# Patient Record
Sex: Female | Born: 1960 | Race: White | Hispanic: No | Marital: Married | Smoking: Never smoker
Health system: Southern US, Community
[De-identification: ages and names within clinical notes are randomized; demographics above are authoritative.]

## PROBLEM LIST (undated history)

## (undated) DIAGNOSIS — T7840XA Allergy, unspecified, initial encounter: Secondary | ICD-10-CM

## (undated) DIAGNOSIS — Z9109 Other allergy status, other than to drugs and biological substances: Secondary | ICD-10-CM

## (undated) DIAGNOSIS — E079 Disorder of thyroid, unspecified: Secondary | ICD-10-CM

## (undated) DIAGNOSIS — E785 Hyperlipidemia, unspecified: Secondary | ICD-10-CM

## (undated) DIAGNOSIS — N200 Calculus of kidney: Secondary | ICD-10-CM

## (undated) HISTORY — DX: Disorder of thyroid, unspecified: E07.9

## (undated) HISTORY — DX: Calculus of kidney: N20.0

## (undated) HISTORY — DX: Hyperlipidemia, unspecified: E78.5

## (undated) HISTORY — DX: Allergy, unspecified, initial encounter: T78.40XA

## (undated) HISTORY — PX: COLONOSCOPY: SHX174

## (undated) HISTORY — DX: Other allergy status, other than to drugs and biological substances: Z91.09

---
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
## 1997-09-05 ENCOUNTER — Other Ambulatory Visit: Admission: RE | Admit: 1997-09-05 | Discharge: 1997-09-05 | Payer: Self-pay | Admitting: Obstetrics and Gynecology

## 1998-09-16 ENCOUNTER — Other Ambulatory Visit: Admission: RE | Admit: 1998-09-16 | Discharge: 1998-09-16 | Payer: Self-pay | Admitting: Obstetrics and Gynecology

## 1999-09-25 ENCOUNTER — Other Ambulatory Visit: Admission: RE | Admit: 1999-09-25 | Discharge: 1999-09-25 | Payer: Self-pay | Admitting: Gynecology

## 2001-08-28 ENCOUNTER — Other Ambulatory Visit: Admission: RE | Admit: 2001-08-28 | Discharge: 2001-08-28 | Payer: Self-pay | Admitting: Gynecology

## 2002-08-08 ENCOUNTER — Other Ambulatory Visit: Admission: RE | Admit: 2002-08-08 | Discharge: 2002-08-08 | Payer: Self-pay | Admitting: Gynecology

## 2003-09-04 ENCOUNTER — Other Ambulatory Visit: Admission: RE | Admit: 2003-09-04 | Discharge: 2003-09-04 | Payer: Self-pay | Admitting: Gynecology

## 2004-09-14 ENCOUNTER — Other Ambulatory Visit: Admission: RE | Admit: 2004-09-14 | Discharge: 2004-09-14 | Payer: Self-pay | Admitting: Gynecology

## 2005-02-15 ENCOUNTER — Other Ambulatory Visit: Admission: RE | Admit: 2005-02-15 | Discharge: 2005-02-15 | Payer: Self-pay | Admitting: Gynecology

## 2005-11-05 DIAGNOSIS — N200 Calculus of kidney: Secondary | ICD-10-CM

## 2005-11-05 HISTORY — DX: Calculus of kidney: N20.0

## 2005-11-08 ENCOUNTER — Other Ambulatory Visit: Admission: RE | Admit: 2005-11-08 | Discharge: 2005-11-08 | Payer: Self-pay | Admitting: Gynecology

## 2005-11-09 ENCOUNTER — Ambulatory Visit: Payer: Self-pay | Admitting: Family Medicine

## 2006-06-07 DIAGNOSIS — E079 Disorder of thyroid, unspecified: Secondary | ICD-10-CM

## 2006-06-07 HISTORY — DX: Disorder of thyroid, unspecified: E07.9

## 2006-06-20 ENCOUNTER — Ambulatory Visit: Payer: Self-pay | Admitting: Family Medicine

## 2006-08-29 ENCOUNTER — Ambulatory Visit: Payer: Self-pay | Admitting: Family Medicine

## 2006-08-29 LAB — CONVERTED CEMR LAB
ALT: 23 units/L (ref 0–40)
AST: 26 units/L (ref 0–37)
Albumin: 3.7 g/dL (ref 3.5–5.2)
Alkaline Phosphatase: 35 units/L — ABNORMAL LOW (ref 39–117)
BUN: 10 mg/dL (ref 6–23)
Basophils Absolute: 0.1 10*3/uL (ref 0.0–0.1)
Basophils Relative: 1 % (ref 0.0–1.0)
Bilirubin, Direct: 0.1 mg/dL (ref 0.0–0.3)
CO2: 30 meq/L (ref 19–32)
Calcium: 9.1 mg/dL (ref 8.4–10.5)
Chloride: 105 meq/L (ref 96–112)
Creatinine, Ser: 0.8 mg/dL (ref 0.4–1.2)
Eosinophils Absolute: 0.1 10*3/uL (ref 0.0–0.6)
Eosinophils Relative: 1.4 % (ref 0.0–5.0)
GFR calc Af Amer: 101 mL/min
GFR calc non Af Amer: 83 mL/min
Glucose, Bld: 91 mg/dL (ref 70–99)
HCT: 43.5 % (ref 36.0–46.0)
Hemoglobin: 14.8 g/dL (ref 12.0–15.0)
Lymphocytes Relative: 27.3 % (ref 12.0–46.0)
MCHC: 34 g/dL (ref 30.0–36.0)
MCV: 92.9 fL (ref 78.0–100.0)
Monocytes Absolute: 0.3 10*3/uL (ref 0.2–0.7)
Monocytes Relative: 6 % (ref 3.0–11.0)
Neutro Abs: 3.3 10*3/uL (ref 1.4–7.7)
Neutrophils Relative %: 64.3 % (ref 43.0–77.0)
Platelets: 294 10*3/uL (ref 150–400)
Potassium: 4.1 meq/L (ref 3.5–5.1)
RBC: 4.68 M/uL (ref 3.87–5.11)
RDW: 11.8 % (ref 11.5–14.6)
Sodium: 140 meq/L (ref 135–145)
TSH: 9.01 microintl units/mL — ABNORMAL HIGH (ref 0.35–5.50)
Total Bilirubin: 0.8 mg/dL (ref 0.3–1.2)
Total Protein: 6.9 g/dL (ref 6.0–8.3)
WBC: 5.2 10*3/uL (ref 4.5–10.5)

## 2006-09-06 ENCOUNTER — Ambulatory Visit: Payer: Self-pay | Admitting: Family Medicine

## 2006-09-06 LAB — CONVERTED CEMR LAB
Free T4: 0.5 ng/dL — ABNORMAL LOW (ref 0.6–1.6)
T3 Uptake Ratio: 34.5 % (ref 22.5–37.0)
T3, Free: 3.5 pg/mL (ref 2.3–4.2)
TSH: 8.76 microintl units/mL — ABNORMAL HIGH (ref 0.35–5.50)

## 2006-11-10 ENCOUNTER — Other Ambulatory Visit: Admission: RE | Admit: 2006-11-10 | Discharge: 2006-11-10 | Payer: Self-pay | Admitting: Gynecology

## 2006-11-11 ENCOUNTER — Ambulatory Visit: Payer: Self-pay | Admitting: Family Medicine

## 2006-11-11 DIAGNOSIS — E538 Deficiency of other specified B group vitamins: Secondary | ICD-10-CM | POA: Insufficient documentation

## 2006-11-11 DIAGNOSIS — M7120 Synovial cyst of popliteal space [Baker], unspecified knee: Secondary | ICD-10-CM | POA: Insufficient documentation

## 2006-11-11 DIAGNOSIS — J301 Allergic rhinitis due to pollen: Secondary | ICD-10-CM | POA: Insufficient documentation

## 2006-12-29 ENCOUNTER — Telehealth: Payer: Self-pay | Admitting: Family Medicine

## 2007-01-17 ENCOUNTER — Ambulatory Visit: Payer: Self-pay | Admitting: Family Medicine

## 2007-01-17 ENCOUNTER — Telehealth (INDEPENDENT_AMBULATORY_CARE_PROVIDER_SITE_OTHER): Payer: Self-pay | Admitting: *Deleted

## 2007-01-17 DIAGNOSIS — L255 Unspecified contact dermatitis due to plants, except food: Secondary | ICD-10-CM | POA: Insufficient documentation

## 2007-07-03 ENCOUNTER — Ambulatory Visit: Payer: Self-pay | Admitting: Internal Medicine

## 2007-07-03 DIAGNOSIS — R599 Enlarged lymph nodes, unspecified: Secondary | ICD-10-CM | POA: Insufficient documentation

## 2007-07-03 DIAGNOSIS — M26629 Arthralgia of temporomandibular joint, unspecified side: Secondary | ICD-10-CM | POA: Insufficient documentation

## 2007-07-03 DIAGNOSIS — E039 Hypothyroidism, unspecified: Secondary | ICD-10-CM | POA: Insufficient documentation

## 2007-07-14 ENCOUNTER — Telehealth (INDEPENDENT_AMBULATORY_CARE_PROVIDER_SITE_OTHER): Payer: Self-pay | Admitting: *Deleted

## 2007-08-22 ENCOUNTER — Telehealth (INDEPENDENT_AMBULATORY_CARE_PROVIDER_SITE_OTHER): Payer: Self-pay | Admitting: *Deleted

## 2007-11-30 ENCOUNTER — Other Ambulatory Visit: Admission: RE | Admit: 2007-11-30 | Discharge: 2007-11-30 | Payer: Self-pay | Admitting: Gynecology

## 2007-12-06 ENCOUNTER — Encounter: Payer: Self-pay | Admitting: Family Medicine

## 2007-12-06 LAB — CONVERTED CEMR LAB: Pap Smear: NORMAL

## 2007-12-18 ENCOUNTER — Ambulatory Visit: Payer: Self-pay | Admitting: Family Medicine

## 2007-12-22 ENCOUNTER — Telehealth (INDEPENDENT_AMBULATORY_CARE_PROVIDER_SITE_OTHER): Payer: Self-pay | Admitting: *Deleted

## 2008-02-27 ENCOUNTER — Telehealth (INDEPENDENT_AMBULATORY_CARE_PROVIDER_SITE_OTHER): Payer: Self-pay | Admitting: *Deleted

## 2008-07-02 ENCOUNTER — Ambulatory Visit: Payer: Self-pay | Admitting: Family Medicine

## 2008-07-02 DIAGNOSIS — J069 Acute upper respiratory infection, unspecified: Secondary | ICD-10-CM | POA: Insufficient documentation

## 2008-07-02 LAB — CONVERTED CEMR LAB
Inflenza A Ag: NEGATIVE
Influenza B Ag: NEGATIVE

## 2008-09-05 ENCOUNTER — Telehealth (INDEPENDENT_AMBULATORY_CARE_PROVIDER_SITE_OTHER): Payer: Self-pay | Admitting: *Deleted

## 2008-12-10 ENCOUNTER — Telehealth (INDEPENDENT_AMBULATORY_CARE_PROVIDER_SITE_OTHER): Payer: Self-pay | Admitting: *Deleted

## 2008-12-26 ENCOUNTER — Ambulatory Visit: Payer: Self-pay | Admitting: Family Medicine

## 2008-12-26 DIAGNOSIS — R5381 Other malaise: Secondary | ICD-10-CM | POA: Insufficient documentation

## 2008-12-26 DIAGNOSIS — R5383 Other fatigue: Secondary | ICD-10-CM | POA: Insufficient documentation

## 2008-12-31 ENCOUNTER — Telehealth (INDEPENDENT_AMBULATORY_CARE_PROVIDER_SITE_OTHER): Payer: Self-pay | Admitting: *Deleted

## 2008-12-31 LAB — CONVERTED CEMR LAB
ALT: 14 units/L (ref 0–35)
AST: 21 units/L (ref 0–37)
Albumin: 3.7 g/dL (ref 3.5–5.2)
Alkaline Phosphatase: 30 units/L — ABNORMAL LOW (ref 39–117)
BUN: 13 mg/dL (ref 6–23)
Basophils Absolute: 0 10*3/uL (ref 0.0–0.1)
Basophils Relative: 0.2 % (ref 0.0–3.0)
Bilirubin, Direct: 0.1 mg/dL (ref 0.0–0.3)
CO2: 29 meq/L (ref 19–32)
Calcium: 8.9 mg/dL (ref 8.4–10.5)
Chloride: 107 meq/L (ref 96–112)
Cholesterol: 172 mg/dL (ref 0–200)
Creatinine, Ser: 0.8 mg/dL (ref 0.4–1.2)
Eosinophils Absolute: 0.1 10*3/uL (ref 0.0–0.7)
Eosinophils Relative: 2.4 % (ref 0.0–5.0)
Folate: 13.5 ng/mL
Free T4: 0.8 ng/dL (ref 0.6–1.6)
GFR calc non Af Amer: 81.96 mL/min (ref >60)
Glucose, Bld: 82 mg/dL (ref 70–99)
HCT: 42.1 % (ref 36.0–46.0)
HDL: 51 mg/dL (ref >39.00)
Hemoglobin: 14.5 g/dL (ref 12.0–15.0)
LDL Cholesterol: 101 mg/dL — ABNORMAL HIGH (ref 0–99)
Lymphocytes Relative: 26.3 % (ref 12.0–46.0)
Lymphs Abs: 1.6 10*3/uL (ref 0.7–4.0)
MCHC: 34.3 g/dL (ref 30.0–36.0)
MCV: 95.7 fL (ref 78.0–100.0)
Monocytes Absolute: 0.3 10*3/uL (ref 0.1–1.0)
Monocytes Relative: 5.9 % (ref 3.0–12.0)
Neutro Abs: 3.9 10*3/uL (ref 1.4–7.7)
Neutrophils Relative %: 65.2 % (ref 43.0–77.0)
Platelets: 245 10*3/uL (ref 150.0–400.0)
Potassium: 4.4 meq/L (ref 3.5–5.1)
RBC: 4.4 M/uL (ref 3.87–5.11)
RDW: 11.3 % — ABNORMAL LOW (ref 11.5–14.6)
Sodium: 140 meq/L (ref 135–145)
T3, Free: 3.1 pg/mL (ref 2.3–4.2)
TSH: 5.4 microintl units/mL (ref 0.35–5.50)
Total Bilirubin: 1 mg/dL (ref 0.3–1.2)
Total CHOL/HDL Ratio: 3
Total Protein: 6.9 g/dL (ref 6.0–8.3)
Triglycerides: 102 mg/dL (ref 0.0–149.0)
VLDL: 20.4 mg/dL (ref 0.0–40.0)
Vitamin B-12: 183 pg/mL — ABNORMAL LOW (ref 211–911)
WBC: 5.9 10*3/uL (ref 4.5–10.5)

## 2009-01-01 ENCOUNTER — Ambulatory Visit: Payer: Self-pay | Admitting: Family Medicine

## 2009-01-01 DIAGNOSIS — D518 Other vitamin B12 deficiency anemias: Secondary | ICD-10-CM | POA: Insufficient documentation

## 2009-01-08 ENCOUNTER — Ambulatory Visit: Payer: Self-pay | Admitting: Family Medicine

## 2009-01-15 ENCOUNTER — Telehealth (INDEPENDENT_AMBULATORY_CARE_PROVIDER_SITE_OTHER): Payer: Self-pay | Admitting: *Deleted

## 2009-01-15 ENCOUNTER — Ambulatory Visit: Payer: Self-pay | Admitting: Family Medicine

## 2009-01-22 ENCOUNTER — Ambulatory Visit: Payer: Self-pay | Admitting: Family Medicine

## 2009-04-30 ENCOUNTER — Ambulatory Visit: Payer: Self-pay | Admitting: Women's Health

## 2009-04-30 ENCOUNTER — Other Ambulatory Visit: Admission: RE | Admit: 2009-04-30 | Discharge: 2009-04-30 | Payer: Self-pay | Admitting: Gynecology

## 2009-07-08 ENCOUNTER — Telehealth (INDEPENDENT_AMBULATORY_CARE_PROVIDER_SITE_OTHER): Payer: Self-pay | Admitting: *Deleted

## 2009-07-09 ENCOUNTER — Ambulatory Visit: Payer: Self-pay | Admitting: Family Medicine

## 2009-07-11 ENCOUNTER — Encounter: Payer: Self-pay | Admitting: Family Medicine

## 2009-11-05 ENCOUNTER — Telehealth (INDEPENDENT_AMBULATORY_CARE_PROVIDER_SITE_OTHER): Payer: Self-pay | Admitting: *Deleted

## 2009-11-19 ENCOUNTER — Ambulatory Visit: Payer: Self-pay | Admitting: Family Medicine

## 2009-11-21 LAB — CONVERTED CEMR LAB
ALT: 19 units/L (ref 0–35)
AST: 24 units/L (ref 0–37)
Albumin: 4 g/dL (ref 3.5–5.2)
Alkaline Phosphatase: 37 units/L — ABNORMAL LOW (ref 39–117)
Bilirubin, Direct: 0.1 mg/dL (ref 0.0–0.3)
Cholesterol: 190 mg/dL (ref 0–200)
Folate: 14.3 ng/mL
HDL: 61.6 mg/dL (ref >39.00)
LDL Cholesterol: 119 mg/dL — ABNORMAL HIGH (ref 0–99)
Total Bilirubin: 0.6 mg/dL (ref 0.3–1.2)
Total CHOL/HDL Ratio: 3
Total Protein: 6.7 g/dL (ref 6.0–8.3)
Triglycerides: 47 mg/dL (ref 0.0–149.0)
VLDL: 9.4 mg/dL (ref 0.0–40.0)
Vitamin B-12: 402 pg/mL (ref 211–911)

## 2009-11-27 ENCOUNTER — Encounter: Payer: Self-pay | Admitting: Family Medicine

## 2010-05-08 ENCOUNTER — Other Ambulatory Visit
Admission: RE | Admit: 2010-05-08 | Discharge: 2010-05-08 | Payer: Self-pay | Source: Home / Self Care | Admitting: Gynecology

## 2010-05-08 ENCOUNTER — Ambulatory Visit: Payer: Self-pay | Admitting: Women's Health

## 2010-05-29 ENCOUNTER — Encounter: Payer: Self-pay | Admitting: Family Medicine

## 2010-07-07 NOTE — Progress Notes (Signed)
  Phone Note Call from Patient   Caller: Patient Summary of Call: pt called in re to her rx Levothyroxine, has been out for  5 days now, (rx filled 12/04/08 on our end) called and spoke with [facility] pharmacy who did not receive electronic rx, gave verbal refill which was don 12/04/08. Called pt and informed pharmacist  getting rx ready apoligized for the inconvenience Initial call taken by: Kandice Hams,  December 10, 2008 9:40 AM

## 2010-07-07 NOTE — Progress Notes (Signed)
Summary: TB skin test  Phone Note Outgoing Call   Summary of Call: Called and left message for pt- we got her forms but we do not have a recent TB skin test on file for her, she will need to come in and have one done before the form can be completed. Army Fossa CMA  July 08, 2009 12:07 PM

## 2010-07-07 NOTE — Progress Notes (Signed)
Summary: [facility] 09/05/08  Phone Note Call from Patient Call back at Home Phone [phone]   Caller: Patient Call For: Loreen Freud DO Summary of Call: Patient called office not feeling well would like call back.  Left message for patient to call the office. Ardyth Man  September 05, 2008 10:29 AM   Follow-up for Phone Call        Patient coming in on Monday at 1:30 09/09/08. Ardyth Man  September 05, 2008 11:37 AM  Follow-up by: Ardyth Man,  September 05, 2008 11:37 AM

## 2010-07-07 NOTE — Progress Notes (Signed)
Summary: RX B12//[facility]  Phone Note Call from Patient   Caller: Patient Summary of Call: Patient is requesting a prescription for B12 so a nurse can give her one at home. contact#[phone] pharmacy [facility] [address]. Initial call taken by: Barb Merino,  January 15, 2009 8:54 AM  Follow-up for Phone Call        B12  1ml subcutaneously weekly for 1 months then monthly--  she will need syringes as well 1 vial b12 Follow-up by: Loreen Freud DO,  January 15, 2009 12:27 PM  Additional Follow-up for Phone Call Additional follow up Details #1::        pt aware rx sent to pharmacy.Marland KitchenMarland KitchenFelecia Deloach CMA  January 16, 2009 9:40 AM    New/Updated Medications: CYANOCOBALAMIN 1000 MCG/ML SOLN (CYANOCOBALAMIN) 1ml subcutaneously weekly for 1 months then monthly SYRINGE 2-3 ML 3 ML MISC (SYRINGE (DISPOSABLE)) 1ml subcutaneously weekly for 1 months then monthly Prescriptions: SYRINGE 2-3 ML 3 ML MISC (SYRINGE (DISPOSABLE)) 1ml subcutaneously weekly for 1 months then monthly  #30 days x 2   Entered by:   Jeremy Johann CMA   Authorized by:   Loreen Freud DO   Signed by:   Jeremy Johann CMA on 01/16/2009   Method used:   Faxed to ...       [facility]       [address]       [place], [place]  [address]       Ph: [phone]       Fax: [phone]   RxID:   [id number] CYANOCOBALAMIN 1000 MCG/ML SOLN (CYANOCOBALAMIN) 1ml subcutaneously weekly for 1 months then monthly  #30 days x 2   Entered by:   Jeremy Johann CMA   Authorized by:   Loreen Freud DO   Signed by:   Jeremy Johann CMA on 01/16/2009   Method used:   Faxed to ...       [facility]       [address]       [place], [place]  [address]       Ph: [phone]       Fax: [phone]   RxID:   [id number]

## 2010-07-07 NOTE — Progress Notes (Signed)
Summary: refill - dr Laury Axon  Phone Note Refill Request   Refills Requested: Medication #1:  ZOCOR 40 MG TABS 1 at bedtime   Last Refilled: 440347 patient said pharm faxed request several times we never received she has been out of med a week [facility] [address] ---- [facility] am i spoke with the pharm he faxed request which took 20 mins to get here - l  Initial call taken by: Okey Regal Spring,  July 14, 2007 9:27 AM    New/Updated Medications: ZOCOR 40 MG TABS (SIMVASTATIN) 1 at bedtime   Prescriptions: ZOCOR 40 MG TABS (SIMVASTATIN) 1 at bedtime  #30 x 0   Entered by:   Doristine Devoid   Authorized by:   Loreen Freud DO   Signed by:   Doristine Devoid on 07/14/2007   Method used:   Electronically sent to ...       [facility]       [address]       [place], [place]  [address]       Ph: [phone]       Fax: [phone]   RxID:   [id number]

## 2010-07-07 NOTE — Assessment & Plan Note (Signed)
Summary: acute only for vomiting/Ph   Vital Signs:  Patient Profile:   50 Years Old Female Height:     62 inches Weight:      127.4 pounds Temp:     98.3 degrees F oral Pulse rate:   72 / minute BP sitting:   100 / 68  (left arm)  Pt. in pain?   no  Vitals Entered By: Jeremy Johann CMA (July 02, 2008 3:32 PM)                  PCP:  Laury Axon  Chief Complaint:  onset sunday vomiting, diarrhea, fever 100 to 101, and pain in rt side of face.  History of Present Illness: Pt here c/o nvd on Sunday that stopped next day and now left side of face hurts and teeth.  + fever as high as 101.  Pt was taking allergy sinus med and tylenol with some relief but she still feels pain in jaw and forhead.      Current Allergies (reviewed today): No known allergies   Past Medical History:    Reviewed history from 07/03/2007 and no changes required:       Hyperlipidemia       Hypothyroidism   Family History:    Reviewed history from 11/11/2006 and no changes required:       Family History of Prostate CA 1st degree relative <50  Social History:    Reviewed history from 12/18/2007 and no changes required:       Never Smoked       Regular exercise-yes       Married       Alcohol use-no       Drug use-no       Occupation: housewife   Risk Factors: Tobacco use:  never Passive smoke exposure:  no Drug use:  no HIV high-risk behavior:  no Caffeine use:  0 drinks per day Alcohol use:  no Exercise:  yes    Times per week:  5    Type:  running Seatbelt use:  100 %  Family History Risk Factors:    Family History of MI in females < 22 years old:  no    Family History of MI in males < 69 years old:  no  Mammogram History:    Date of Last Mammogram:  12/06/2007  PAP Smear History:    Date of Last PAP Smear:  12/06/2007   Review of Systems      See HPI   Physical Exam  General:     Well-developed,well-nourished,in no acute distress; alert,appropriate and cooperative  throughout examination Ears:     External ear exam shows no significant lesions or deformities.  Otoscopic examination reveals clear canals, tympanic membranes are intact bilaterally without bulging, retraction, inflammation or discharge. Hearing is grossly normal bilaterally. Nose:     External nasal examination shows no deformity or inflammation. Nasal mucosa are pink and moist without lesions or exudates. Mouth:     Oral mucosa and oropharynx without lesions or exudates.  Teeth in good repair. Neck:     No deformities, masses, or tenderness noted. Lungs:     Normal respiratory effort, chest expands symmetrically. Lungs are clear to auscultation, no crackles or wheezes. Heart:     normal rate, regular rhythm, and no murmur.   Skin:     Intact without suspicious lesions or rashes Cervical Nodes:     No lymphadenopathy noted Psych:     Cognition and judgment  appear intact. Alert and cooperative with normal attention span and concentration. No apparent delusions, illusions, hallucinations    Impression & Recommendations:  Problem # 1:  VIRAL URI (ICD-465.9) Instructed on symptomatic treatment. Call if symptoms persist or worsen.  Orders: Flu A+B (13244)   Complete Medication List: 1)  Zocor 40 Mg Tabs (Simvastatin) .Marland Kitchen.. 1 at bedtime 2)  Seasonique Tabs (Levonorgest-eth estrad 91-day tabs) .Marland Kitchen.. 1 once daily 3)  Levothyroxine Sodium 50 Mcg Tabs (Levothyroxine sodium) .... Take one tablet daily.     Laboratory Results   Date/Time Reported: July 02, 2008 4:25 PM  Other Tests  Influenza A: negative Influenza B: negative

## 2010-07-07 NOTE — Progress Notes (Signed)
  Phone Note Call from Patient Call back at Home Phone [phone]   Caller: Patient Call For: Loreen Freud DO Summary of Call: Patient would like lab results. Ardyth Man  December 31, 2008 4:20 PM   Follow-up for Phone Call        see labs Follow-up by: Loreen Freud DO,  December 31, 2008 4:58 PM  Additional Follow-up for Phone Call Additional follow up Details #1::        cholesterol--- good---recheck 6 months-----272.4  lipid, hep b12 low---- injections weekly for 1 month then 1 x a month---recheck 1 month Patient aware and is coming in today for her 1st b12 injection. Ardyth Man  January 01, 2009 10:20 AM  Additional Follow-up by: Ardyth Man,  January 01, 2009 10:20 AM

## 2010-07-07 NOTE — Progress Notes (Signed)
Summary: refill   Phone Note Refill Request   Refills Requested: Medication #1:  ZOCOR 40 MG TABS 1 at bedtime [facility] [address] patient has office on 4.29.09 @ 10:00  Initial call taken by: Charolette Child,  August 22, 2007 2:34 PM      Prescriptions: ZOCOR 40 MG TABS (SIMVASTATIN) 1 at bedtime  #30 x 1   Entered by:   Shonna Chock   Authorized by:   Loreen Freud DO   Signed by:   Shonna Chock on 08/22/2007   Method used:   Electronically sent to ...       [facility]       [address]       [place], [place]  [address]       Ph: [phone]       Fax: [phone]   RxID:   [id number]

## 2010-07-07 NOTE — Assessment & Plan Note (Signed)
Summary: cpx--tl   Vital Signs:  Patient Profile:   50 Years Old Female Height:     62 inches Weight:      129.4 pounds Pulse rate:   70 / minute Resp:     12 per minute BP sitting:   108 / 64  (left arm)  Vitals Entered By: Doristine Devoid (December 18, 2007 8:11 AM)                 PCP:  Laury Axon  Chief Complaint:  cpx.  History of Present Illness: Pt here for CPE.  Labs and pap done at gyn office.  See labs.  No complaints.  Hyperlipidemia Follow-Up      This is a 50 year old woman who presents for Hyperlipidemia follow-up.  The patient denies muscle aches, GI upset, abdominal pain, flushing, itching, constipation, diarrhea, and fatigue.  The patient denies the following symptoms: chest pain/pressure, exercise intolerance, dypsnea, palpitations, syncope, and pedal edema.  Compliance with medications (by patient report) has been near 100%.  Dietary compliance has been good.  The patient reports exercising daily.  Adjunctive measures currently used by the patient include weight reduction.      Current Allergies: No known allergies   Past Medical History:    Reviewed history from 07/03/2007 and no changes required:       Hyperlipidemia       Hypothyroidism  Past Surgical History:    Reviewed history from 11/11/2006 and no changes required:       Caesarean section       L Bakers cyst   Family History:    Reviewed history from 11/11/2006 and no changes required:       Family History of Prostate CA 1st degree relative <50  Social History:    Reviewed history from 07/03/2007 and no changes required:       Never Smoked       Regular exercise-yes       Married       Alcohol use-no       Drug use-no       Occupation: housewife   Risk Factors:  Tobacco use:  never Passive smoke exposure:  no Drug use:  no HIV high-risk behavior:  no Caffeine use:  0 drinks per day Alcohol use:  no Exercise:  yes    Times per week:  5    Type:  running Seatbelt use:  100 %   Family History Risk Factors:    Family History of MI in females < 43 years old:  no    Family History of MI in males < 92 years old:  no  PAP Smear History:     Date of Last PAP Smear:  12/06/2007    Results:  Normal   Mammogram History:     Date of Last Mammogram:  12/06/2007    Results:  Normal Bilateral    Review of Systems      See HPI  General      Denies chills, fatigue, fever, loss of appetite, malaise, sleep disorder, sweats, weakness, and weight loss.  Eyes      Denies blurring, discharge, double vision, eye irritation, eye pain, halos, itching, light sensitivity, red eye, vision loss-1 eye, and vision loss-both eyes.      optho--appoint in August  ENT      Denies decreased hearing, difficulty swallowing, ear discharge, earache, hoarseness, nasal congestion, nosebleeds, postnasal drainage, ringing in ears, sinus pressure, and sore throat.  dentist--q23m  CV      Denies bluish discoloration of lips or nails, chest pain or discomfort, difficulty breathing at night, difficulty breathing while lying down, fainting, fatigue, leg cramps with exertion, lightheadness, near fainting, palpitations, shortness of breath with exertion, swelling of feet, swelling of hands, and weight gain.  Resp      Denies chest discomfort, chest pain with inspiration, cough, coughing up blood, excessive snoring, hypersomnolence, morning headaches, pleuritic, shortness of breath, sputum productive, and wheezing.  GI      Denies abdominal pain, bloody stools, change in bowel habits, constipation, dark tarry stools, diarrhea, excessive appetite, gas, hemorrhoids, indigestion, loss of appetite, nausea, vomiting, vomiting blood, and yellowish skin color.  GU      Denies abnormal vaginal bleeding, decreased libido, discharge, dysuria, genital sores, hematuria, incontinence, nocturia, urinary frequency, and urinary hesitancy.  MS      Denies joint pain, joint redness, joint swelling, loss of  strength, low back pain, mid back pain, muscle aches, muscle , cramps, muscle weakness, stiffness, and thoracic pain.  Derm      Denies changes in color of skin, changes in nail beds, dryness, excessive perspiration, flushing, hair loss, insect bite(s), itching, lesion(s), poor wound healing, and rash.  Neuro      Denies brief paralysis, difficulty with concentration, disturbances in coordination, falling down, headaches, inability to speak, memory loss, numbness, poor balance, seizures, sensation of room spinning, tingling, tremors, visual disturbances, and weakness.  Psych      Denies alternate hallucination ( auditory/visual), anxiety, depression, easily angered, easily tearful, irritability, mental problems, panic attacks, sense of great danger, suicidal thoughts/plans, thoughts of violence, unusual visions or sounds, and thoughts /plans of harming others.  Endo      Denies cold intolerance, excessive hunger, excessive thirst, excessive urination, heat intolerance, polyuria, and weight change.  Heme      Denies abnormal bruising, bleeding, enlarge lymph nodes, fevers, pallor, and skin discoloration.  Allergy      Denies hives or rash, itching eyes, persistent infections, seasonal allergies, and sneezing.   Physical Exam  General:     Well-developed,well-nourished,in no acute distress; alert,appropriate and cooperative throughout examination Head:     Normocephalic and atraumatic without obvious abnormalities. No apparent alopecia or balding. Eyes:     vision grossly intact, pupils equal, pupils round, pupils reactive to light, and no injection.   Ears:     External ear exam shows no significant lesions or deformities.  Otoscopic examination reveals clear canals, tympanic membranes are intact bilaterally without bulging, retraction, inflammation or discharge. Hearing is grossly normal bilaterally. Nose:     External nasal examination shows no deformity or inflammation. Nasal mucosa  are pink and moist without lesions or exudates. Mouth:     Oral mucosa and oropharynx without lesions or exudates.  Teeth in good repair. Neck:     No deformities, masses, or tenderness noted.no carotid bruits.   Chest Wall:     No deformities, masses, or tenderness noted. Breasts:     gyn Lungs:     Normal respiratory effort, chest expands symmetrically. Lungs are clear to auscultation, no crackles or wheezes. Heart:     Normal rate and regular rhythm. S1 and S2 normal without gallop, murmur, click, rub or other extra sounds. Abdomen:     Bowel sounds positive,abdomen soft and non-tender without masses, organomegaly or hernias noted. Rectal:     gyn Genitalia:     gyn Msk:     normal ROM, no joint  tenderness, no joint swelling, no joint warmth, no redness over joints, no joint deformities, no joint instability, and no crepitation.   Pulses:     R posterior tibial normal, R dorsalis pedis normal, R carotid normal, L posterior tibial normal, L dorsalis pedis normal, and L carotid normal.   Extremities:     No clubbing, cyanosis, edema, or deformity noted with normal full range of motion of all joints.   Neurologic:     No cranial nerve deficits noted. Station and gait are normal. Plantar reflexes are down-going bilaterally. DTRs are symmetrical throughout. Sensory, motor and coordinative functions appear intact. Skin:     Intact without suspicious lesions or rashes Cervical Nodes:     No lymphadenopathy noted Psych:     Cognition and judgment appear intact. Alert and cooperative with normal attention span and concentration. No apparent delusions, illusions, hallucinations    Impression & Recommendations:  Problem # 1:  PREVENTIVE HEALTH CARE (ICD-V70.0) [facility] Utd check fasting labs\par mammo and pap per gyn Orders: EKG w/ Interpretation (93000)   Problem # 2:  HYPOTHYROIDISM (ICD-244.9) see labs from gyn Her updated medication list for this problem includes:    Synthroid  50 Mcg Tabs (Levothyroxine sodium) .Marland Kitchen... 1 by mouth once daily  Labs Reviewed: TSH: 8.76 (09/06/2006)     Orders: EKG w/ Interpretation (93000)   Problem # 3:  ALLERGIC RHINITIS, SEASONAL (ICD-477.0)  Orders: EKG w/ Interpretation (93000)   Problem # 4:  HYPERLIPIDEMIA (ICD-272.4) see labs from gyn--- recheck 6 months Her updated medication list for this problem includes:    Zocor 40 Mg Tabs (Simvastatin) .Marland Kitchen... 1 at bedtime  Labs Reviewed: SGOT: 26 (08/29/2006)   SGPT: 23 (08/29/2006)  Orders: EKG w/ Interpretation (93000)   Complete Medication List: 1)  Zocor 40 Mg Tabs (Simvastatin) .Marland Kitchen.. 1 at bedtime 2)  Seasonique Tabs (Levonorgest-eth estrad 91-day tabs) .Marland Kitchen.. 1 once daily 3)  Synthroid 50 Mcg Tabs (Levothyroxine sodium) .Marland Kitchen.. 1 by mouth once daily   Patient Instructions: 1)  fasting labs 6 months  272.4 244.9 TSH, hep, lipid   Prescriptions: SYNTHROID 50 MCG  TABS (LEVOTHYROXINE SODIUM) 1 by mouth once daily  #90 x 3   Entered by:   Doristine Devoid   Authorized by:   Loreen Freud DO   Signed by:   Doristine Devoid on 12/18/2007   Method used:   Print then Give to Patient   RxID:   [id number] SYNTHROID 50 MCG  TABS (LEVOTHYROXINE SODIUM) 1 by mouth once daily  NEED FASTING LAB OV Brand medically necessary #90 x 3   Entered and Authorized by:   Loreen Freud DO   Signed by:   Loreen Freud DO on 12/18/2007   Method used:   Historical   RxID:   [id number]  ]  EKG  Procedure date:  12/18/2007  Findings:      sinus rhythm 80   EKG  Procedure date:  12/18/2007  Findings:      sinus rhythm 80    Tetanus/Td Immunization History:    Tetanus/Td # 1:  Td (08/16/2001)

## 2010-07-07 NOTE — Progress Notes (Signed)
Summary: change in medication  Phone Note Call from Patient Call back at Home Phone [phone]   Caller: Patient Summary of Call: patient called wanted to know if there is another thyroid medication new prescription for brand synthroid cost her $50 dollars for 3 months were as she used to get generic medication free so would like to see if Dr. Laury Axon would consider change Initial call taken by: Doristine Devoid,  December 22, 2007 11:03 AM  Follow-up for Phone Call        Dr. Laury Axon,  Can we switch back to generic?  Thanks,  Marcelino Duster Follow-up by: Ardyth Man,  December 22, 2007 11:34 AM  Additional Follow-up for Phone Call Additional follow up Details #1::        yes Additional Follow-up by: Loreen Freud DO,  December 22, 2007 3:44 PM    Additional Follow-up for Phone Call Additional follow up Details #2::    patient aware rx for generic has been sent in.  Ardyth Man  December 22, 2007 4:05 PM  Follow-up by: Ardyth Man,  December 22, 2007 4:05 PM  New/Updated Medications: LEVOTHYROXINE SODIUM 50 MCG  TABS (LEVOTHYROXINE SODIUM) Take one tablet daily.   Prescriptions: LEVOTHYROXINE SODIUM 50 MCG  TABS (LEVOTHYROXINE SODIUM) Take one tablet daily.  #30 x 1   Entered by:   Ardyth Man   Authorized by:   Loreen Freud DO   Signed by:   Ardyth Man on 12/22/2007   Method used:   Electronically sent to ...       [facility]       [address]       [place], [place]  [address]       Ph: [phone]       Fax: [phone]   RxID:   [id number]

## 2010-07-07 NOTE — Letter (Signed)
Summary: Physical Exam Form/[employer]  Physical Exam Form/[employer]   Imported By: Lanelle Bal 07/18/2009 08:49:48  _____________________________________________________________________  External Attachment:    Type:   Image     Comment:   External Document

## 2010-07-07 NOTE — Progress Notes (Signed)
Summary: lmam needs lab appt  Phone Note Outgoing Call   Reason for Call: Confirm/change Appt Summary of Call: Lab appt:  -272.4  lipid, hep, b12/folate  Follow-up for Phone Call        lm am to schedule lab appt .Marland KitchenOkey Regal Spring  November 05, 2009 12:03 PM   Additional Follow-up for Phone Call Additional follow up Details #1::        Patient has an appt on 6.15.11 Additional Follow-up by: Harold Barban,  November 05, 2009 2:17 PM

## 2010-07-07 NOTE — Assessment & Plan Note (Signed)
Summary: EYE PROBLEMS//TL   Vital Signs:  Patient Profile:   50 Years Old Female Weight:      129.4 pounds Temp:     98.1 degrees F oral Pulse rate:   76 / minute BP sitting:   90 / 64  (left arm)  Vitals Entered By: Shonna Chock (November 11, 2006 9:15 AM)               PCP:  Laury Axon  Chief Complaint:  ITCHY and WATERY EYES AND RUNNY NOSE.Marland Kitchen  History of Present Illness: Pt here c/o eyes red and runny nose and eys.   Very itchy. Pt using Sudafed and mucinex. Pt also tried Claritin with no relief.  No fever.  NO cough.    Current Allergies: No known allergies   Past Medical History:    Hyperlipidemia  Past Surgical History:    Caesarean section   Family History:    Family History of Prostate CA 1st degree relative <50  Social History:    Never Smoked    Regular exercise-yes   Risk Factors:  Tobacco use:  never Exercise:  yes   Review of Systems  General      Denies chills, fatigue, fever, loss of appetite, malaise, sleep disorder, sweats, weakness, and weight loss.  Eyes      Complains of eye irritation, itching, and red eye.  ENT      Denies decreased hearing, difficulty swallowing, ear discharge, earache, hoarseness, nasal congestion, nosebleeds, postnasal drainage, ringing in ears, sinus pressure, and sore throat.  Allergy      Complains of itching eyes, seasonal allergies, and sneezing.   Physical Exam  General:     Well-developed,well-nourished,in no acute distress; alert,appropriate and cooperative throughout examination Eyes:     vision grossly intact, pupils equal, pupils round, pupils reactive to light, conjunctival injection, and excessive tearing.   Ears:     External ear exam shows no significant lesions or deformities.  Otoscopic examination reveals clear canals, tympanic membranes are intact bilaterally without bulging, retraction, inflammation or discharge. Hearing is grossly normal bilaterally. Nose:     External nasal examination  shows no deformity or inflammation. Nasal mucosa are pink and moist without lesions or exudates. Mouth:     Oral mucosa and oropharynx without lesions or exudates.  Teeth in good repair. Neck:     No deformities, masses, or tenderness noted. Lungs:     Normal respiratory effort, chest expands symmetrically. Lungs are clear to auscultation, no crackles or wheezes. Heart:     Normal rate and regular rhythm. S1 and S2 normal without gallop, murmur, click, rub or other extra sounds.    Impression & Recommendations:  Problem # 1:  ALLERGIC RHINITIS, SEASONAL (ICD-477.0) Assessment: New veramyst 2 sprays each nostril once daily  optivar eye drops xyzal samples given with instructions  Medications Added to Medication List This Visit: 1)  Zocor 40 Mg Tabs (Simvastatin) .Marland Kitchen.. 1 at bedtime 2)  Seasonique Tabs (Levonorgest-eth estrad 91-day tabs) .Marland Kitchen.. 1 once daily 3)  Veramyst 27.5 Mcg/spray Susp (Fluticasone furoate) .... 2 sprays each nostril once daily  r   Patient Instructions: 1)  Please schedule a follow-up appointment as needed. 2)  Get plenty of rest, drink lots of clear liquids, and use tylenol or Ibuprophen for fever and comfort. return in 7-10 days if you're not better:sooner if you're feeliong worse. 3)  Clean any discharge from eyelids with baby shampoo and warm water. Be sure to wash your hands often  to avoid spreading and reinfection. If you wear contacts, remove them and wear glasses until infection resolved (be sure and clean lenses before replacing).

## 2010-07-07 NOTE — Progress Notes (Signed)
Summary: arm pain   DR Laury Axon SEE  Phone Note Call from Patient   Caller: Patient Reason for Call: Acute Illness Summary of Call: dr. Laury Axon [phone] [facility] [place] pt is having pain in her right arm. she wanted to ask the nurse she has a pain running from her shoulder to her thumb. before she was having problems from moving her arm up abover her shoulder. she has seen murphy and wyner. they gave her exercise to do and it seemed to help. but now she has pain from her spine to her right thumb. she is not sleeping very well since this pain has started in her arm. she has been taking advil and the medication is not toughing her pain. Initial call taken by: Charolette Child,  December 29, 2006 9:13 AM  Follow-up for Phone Call        DR LOWNE IS SPOKE WITH PT INFORMED SINCE ALREADY SEEN ORTHO CAN JUST CALL ORTHO PT SAID WANTED TO TALK WITH FAM DR/NURSE FIRST TO SEE IF THERE IS ANYTHING CAN TAKE PT HAS ALREADY TAKEN MOTRIN,ES TYLENOL NOT TOUCHING PAIN OFFERED APPT PT REFUSED, SAID  JUST THOUGT MAYBE SOMETHING STRONGER COULD BE OFFERED I INFOMRED PT WOULD NEED OV BEFORE MED PRESCRIBED SEEN IN JUNE BUT FOR ALLERGY RHINITIS, PT SAID FAMILY SEEING SO MANY DRS NOW RE;BILLS. SAID WIL CALL MURPHY/[facility]INER Follow-up by: Kandice Hams,  December 29, 2006 11:51 AM

## 2010-07-07 NOTE — Progress Notes (Signed)
Summary: POISON IVY  Phone Note Call from Patient Call back at Home Phone [phone]   Caller: Patient Summary of Call: PT SAID SHE GOT POISON IVY JUICE IN HER EYE YESTERDAY AND OVER NIGHT IT GOT SWOLLEN AND RED. SHE SAID SHE THINK IT GOT ALL IN HER SYSTEM. PT WANTS TO BE SEEN ASAP.   CALL BACK NUMBER IS [phone] Initial call taken by: Vanessa Sw[place],  January 17, 2007 8:41 AM  Follow-up for Phone Call        spoke with pt ov sched today dr Laury Axon Follow-up by: Kandice Hams,  January 17, 2007 9:01 AM

## 2010-07-07 NOTE — Assessment & Plan Note (Signed)
Summary: poison ivy/alr   Vital Signs:  Patient Profile:   50 Years Old Female Weight:      127.6 pounds Temp:     98.9 degrees F oral Pulse rate:   72 / minute BP sitting:   104 / 70  (left arm) Cuff size:   regular  Vitals Entered By: Shonna Chock (January 17, 2007 1:19 PM)               PCP:  Laury Axon  Chief Complaint:  POISON IVY SINCE YESTERDAY, POIE, and EYE IS SWOLLEN AND RED BUT NOT PAINFUL.SON IVY IN EY.  History of Present Illness: Pt here c/o poison ivy in L eye.  Pt was working in yard yesterday and "juice from poison ivy" went into eye.  Eye now swollen and red.     Current Allergies: No known allergies      Review of Systems      See HPI  General      Denies chills, fatigue, fever, loss of appetite, malaise, sleep disorder, sweats, weakness, and weight loss.  Eyes      Complains of itching and red eye.  Derm      Complains of changes in color of skin and rash.   Physical Exam  General:     Well-developed,well-nourished,in no acute distress; alert,appropriate and cooperative throughout examination Head:     Normocephalic and atraumatic without obvious abnormalities. No apparent alopecia or balding. Eyes:     L eyelid swollen and red sclera red Skin:     red around L eye  Cervical Nodes:     No lymphadenopathy noted    Impression & Recommendations:  Problem # 1:  DERMATITIS, CONTACT, DUE TO PLANTS (ICD-692.6) prednisone taper -- start tomorrow xyzal 5 mg a day  Her updated medication list for this problem includes:    Prednisone 10 Mg Tabs (Prednisone) .Marland KitchenMarland KitchenMarland KitchenMarland Kitchen 3 by mouth once daily for 3 days then 2 by mouth once daily for 3 days then 1 by mouth once daily for 3 days  Orders: Ophthalmology Referral (Ophthalmology) Admin of Therapeutic Inj  intramuscular or subcutaneous (96295) Depo- Medrol 80mg  (J1040) Discussed avoidance of triggers and symptomatic treatment.   Complete Medication List: 1)  Zocor 40 Mg Tabs (Simvastatin) .Marland Kitchen.. 1 at  bedtime 2)  Seasonique Tabs (Levonorgest-eth estrad 91-day tabs) .Marland Kitchen.. 1 once daily 3)  Prednisone 10 Mg Tabs (Prednisone) .... 3 by mouth once daily for 3 days then 2 by mouth once daily for 3 days then 1 by mouth once daily for 3 days     Prescriptions: PREDNISONE 10 MG  TABS (PREDNISONE) 3 by mouth once daily for 3 days then 2 by mouth once daily for 3 days then 1 by mouth once daily for 3 days  #18 x 0   Entered and Authorized by:   Loreen Freud DO   Signed by:   Loreen Freud DO on 01/17/2007   Method used:   Print then Give to Patient   RxID:   [id number]       Medication Administration  Injection # 1:    Medication: Depo- Medrol 80mg     Diagnosis: DERMATITIS, CONTACT, DUE TO PLANTS (ICD-692.6)    Route: IM    Site: RUOQ gluteus    Exp Date: 12/2007    Lot #: [id number]    Mfr: SICOR    Patient tolerated injection without complications    Given by: Shonna Chock (January 17, 2007 2:10 PM)  Orders Added: 1)  Ophthalmology Referral [Ophthalmology] 2)  Admin of Therapeutic Inj  intramuscular or subcutaneous [90772] 3)  Depo- Medrol 80mg  [J1040] 4)  Est. Patient Level III [04540]

## 2010-07-07 NOTE — Assessment & Plan Note (Signed)
Summary: cpx & lab/cbs   Vital Signs:  Patient profile:   50 year old female Height:      62 inches Weight:      126 pounds BMI:     23.13 Temp:     98.4 degrees F oral Pulse rate:   72 / minute Resp:     12 per minute BP sitting:   100 / 64  (right arm)  Vitals Entered By: Ardyth Man (December 26, 2008 8:18 AM) CC: CPX/fasting labs/no pap Is Patient Diabetic? No Pain Assessment Patient in pain? no       Have you ever been in a relationship where you felt threatened, hurt or afraid?No   Does patient need assistance? Functional Status Self care Ambulation Normal   History of Present Illness: Pt here for cpe, no pap and labs.  Pt c/o fatigue and would like thyroid checked.  No other complaints.     Preventive Screening-Counseling & Management  Alcohol-Tobacco     Alcohol drinks/day: 0     Smoking Status: never  Caffeine-Diet-Exercise     Caffeine use/day: 0     Does Patient Exercise: yes     Type of exercise: run , ride     Exercise (avg: min/session): 30-60     Times/week: 4  Hep-HIV-STD-Contraception     Dental Visit-last 6 months yes     Dental Care Counseling: 1x a year     SBE monthly: yes     Sun Exposure-Excessive: yes  Safety-Violence-Falls     Seat Belt Use: yes      Sexual History:  currently monogamous.        Drug Use:  never.        Blood Transfusions:  no.    Current Medications (verified): 1)  Zocor 40 Mg Tabs (Simvastatin) .Marland Kitchen.. 1 At Bedtime 2)  Seasonique  Tabs (Levonorgest-Eth Estrad 91-Day Tabs) .Marland Kitchen.. 1 Once Daily 3)  Levothyroxine Sodium 50 Mcg  Tabs (Levothyroxine Sodium) .... Take One Tablet Daily. Pt Due For Labs Now  Allergies (verified): No Known Drug Allergies  Past History:  Past medical, surgical, family and social histories (including risk factors) reviewed, and no changes noted (except as noted below).  Past Medical History: Reviewed history from 07/03/2007 and no changes required. Hyperlipidemia Hypothyroidism   Past Surgical History: Reviewed history from 12/18/2007 and no changes required. Caesarean section L Bakers cyst  Family History: Reviewed history from 11/11/2006 and no changes required. Family History of Prostate CA 1st degree relative <50  Social History: Reviewed history from 12/18/2007 and no changes required. 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 Eyes:  Denies blurring, discharge, double vision, eye irritation, eye pain, halos, itching, light sensitivity, red eye, vision loss-1 eye, and vision loss-both eyes; optho q2y. ENT:  Denies decreased hearing, difficulty swallowing, ear discharge, earache, hoarseness, nasal congestion, nosebleeds, postnasal drainage, ringing in ears, sinus pressure, and sore throat. CV:  Denies bluish discoloration of lips or nails, chest pain or discomfort, difficulty breathing at night, difficulty breathing while lying down, fainting, fatigue, leg cramps with exertion, lightheadness, near fainting, palpitations, shortness of breath with exertion, swelling of feet,  swelling of hands, and weight gain. Resp:  Denies chest discomfort, chest pain with inspiration, cough, coughing up blood, excessive snoring, hypersomnolence, morning headaches, pleuritic, shortness of breath, sputum productive, and wheezing. GI:  Denies abdominal pain, bloody stools, change in bowel habits, constipation, dark tarry stools, diarrhea, excessive appetite, gas, hemorrhoids, indigestion, loss of appetite, nausea, vomiting, vomiting blood, and yellowish skin color. GU:  Denies abnormal vaginal bleeding, decreased libido,  discharge, dysuria, genital sores, hematuria, incontinence, nocturia, urinary frequency, and urinary hesitancy. MS:  Denies joint pain, joint redness, joint swelling, loss of strength, low back pain, mid back pain, muscle aches, muscle , cramps, muscle weakness, stiffness, and thoracic pain. Derm:  Denies changes in color of skin, changes in nail beds, dryness, excessive perspiration, flushing, hair loss, insect bite(s), itching, lesion(s), poor wound healing, and rash. Neuro:  Denies brief paralysis, difficulty with concentration, disturbances in coordination, falling down, headaches, inability to speak, memory loss, numbness, poor balance, seizures, sensation of room spinning, tingling, tremors, visual disturbances, and weakness. Psych:  Denies alternate hallucination ( auditory/visual), anxiety, depression, easily angered, easily tearful, irritability, mental problems, panic attacks, sense of great danger, suicidal thoughts/plans, thoughts of violence, unusual visions or sounds, and thoughts /plans of harming others. Endo:  Denies cold intolerance, excessive hunger, excessive thirst, excessive urination, heat intolerance, polyuria, and weight change. Heme:  Denies abnormal bruising, bleeding, enlarge lymph nodes, fevers, pallor, and skin discoloration. Allergy:  Denies hives or rash, itching eyes, persistent infections, seasonal allergies, and sneezing.  Physical Exam  General:  Well-developed,well-nourished,in no acute distress; alert,appropriate and cooperative throughout examination Head:  Normocephalic and atraumatic without obvious abnormalities. No apparent alopecia or balding. Eyes:  vision grossly intact, pupils equal, pupils round, pupils reactive to light, and no injection.   Ears:  External ear exam shows no significant lesions or deformities.  Otoscopic examination reveals clear canals, tympanic membranes are intact bilaterally without bulging, retraction, inflammation or discharge.  Hearing is grossly normal bilaterally. Nose:  External nasal examination shows no deformity or inflammation. Nasal mucosa are pink and moist without lesions or exudates. Mouth:  Oral mucosa and oropharynx without lesions or exudates.  Teeth in good repair. Neck:  No deformities, masses, or tenderness noted. Chest Wall:  No deformities, masses, or tenderness noted. Breasts:  gyn Lungs:  Normal respiratory effort, chest expands symmetrically. Lungs are clear to auscultation, no crackles or wheezes. Heart:  Normal rate and regular rhythm. S1 and S2 normal without gallop, murmur, click, rub or other extra sounds. Abdomen:  Bowel sounds positive,abdomen soft and non-tender without masses, organomegaly or hernias noted. Rectal:  gyn Genitalia:  gyn Msk:  normal ROM, no joint tenderness, no joint swelling, no joint warmth, no redness over joints, no joint deformities, no joint instability, and no crepitation.   Pulses:  R and L carotid,radial,femoral,dorsalis pedis and posterior tibial pulses are full and equal bilaterally Extremities:  No clubbing, cyanosis, edema, or deformity noted with normal full range of motion of all joints.   Neurologic:  No cranial nerve deficits noted. Station and gait are normal. Plantar reflexes are down-going bilaterally. DTRs are symmetrical throughout. Sensory, motor and coordinative functions appear intact. Skin:  Intact without suspicious lesions or rashes Cervical Nodes:  No lymphadenopathy noted Axillary Nodes:  No palpable lymphadenopathy Psych:  Cognition and judgment appear intact. Alert and cooperative with normal attention span and concentration. No apparent delusions, illusions, hallucinations   Impression & Recommendations:  Problem # 1:  PREVENTIVE HEALTH CARE (ICD-V70.0)  Orders: Venipuncture (16109) TLB-Lipid  Panel (80061-LIPID) TLB-BMP (Basic Metabolic Panel-BMET) (80048-METABOL) TLB-CBC Platelet - w/Differential (85025-CBCD) TLB-Hepatic/Liver  Function Pnl (80076-HEPATIC) TLB-TSH (Thyroid Stimulating Hormone) (84443-TSH) TLB-B12 + Folate Pnl (82746_82607-B12/FOL) TLB-T4 (Thyrox), Free (84439-FT4R) TLB-T3, Free (Triiodothyronine) (84481-T3FREE) EKG w/ Interpretation (93000)  Problem # 2:  FATIGUE (ICD-780.79)  Orders: TLB-B12 + Folate Pnl (16109_60454-U98/JXB) EKG w/ Interpretation (93000)  Problem # 3:  HYPOTHYROIDISM (ICD-244.9)  Her updated medication list for this problem includes:    Levothyroxine Sodium 50 Mcg Tabs (Levothyroxine sodium) .Marland Kitchen... Take one tablet daily. pt due for labs now  Orders: Venipuncture (14782) TLB-Lipid Panel (80061-LIPID) TLB-BMP (Basic Metabolic Panel-BMET) (80048-METABOL) TLB-CBC Platelet - w/Differential (85025-CBCD) TLB-Hepatic/Liver Function Pnl (80076-HEPATIC) TLB-TSH (Thyroid Stimulating Hormone) (84443-TSH) TLB-B12 + Folate Pnl (95621_30865-H84/ONG) TLB-T4 (Thyrox), Free (84439-FT4R) TLB-T3, Free (Triiodothyronine) (84481-T3FREE) EKG w/ Interpretation (93000)  Labs Reviewed: TSH: 8.76 (09/06/2006)     Problem # 4:  HYPERLIPIDEMIA (ICD-272.4)  Her updated medication list for this problem includes:    Zocor 40 Mg Tabs (Simvastatin) .Marland Kitchen... 1 at bedtime  Orders: Venipuncture (29528) TLB-Lipid Panel (80061-LIPID) TLB-BMP (Basic Metabolic Panel-BMET) (80048-METABOL) TLB-CBC Platelet - w/Differential (85025-CBCD) TLB-Hepatic/Liver Function Pnl (80076-HEPATIC) TLB-TSH (Thyroid Stimulating Hormone) (84443-TSH) TLB-B12 + Folate Pnl (41324_40102-V25/DGU) TLB-T4 (Thyrox), Free 202 200 1030) TLB-T3, Free (Triiodothyronine) (84481-T3FREE) EKG w/ Interpretation (93000)  Labs Reviewed: SGOT: 26 (08/29/2006)   SGPT: 23 (08/29/2006)  Complete Medication List: 1)  Zocor 40 Mg Tabs (Simvastatin) .Marland Kitchen.. 1 at bedtime 2)  Seasonique Tabs (Levonorgest-eth estrad 91-day tabs) .Marland Kitchen.. 1 once daily 3)  Levothyroxine Sodium 50 Mcg Tabs (Levothyroxine sodium) .... Take one tablet daily. pt due for  labs now Prescriptions: LEVOTHYROXINE SODIUM 50 MCG  TABS (LEVOTHYROXINE SODIUM) Take one tablet daily. pt due for labs now  #30 Each x 11   Entered and Authorized by:   Loreen Freud DO   Signed by:   Loreen Freud DO on 12/26/2008   Method used:   Electronically to        [facility] (retail)       [address]       [place], [place]  [address]       Ph: [phone]       Fax: [phone]   RxID:   [id number] ZOCOR 40 MG TABS (SIMVASTATIN) 1 at bedtime  #30 Each x 11   Entered and Authorized by:   Loreen Freud DO   Signed by:   Loreen Freud DO on 12/26/2008   Method used:   Electronically to        [facility] (retail)       [address]       [place], [place]  [address]       Ph: [phone]       Fax: [phone]   RxID:   [id number]    EKG  Procedure date:  12/26/2008  Findings:      sinus rhythm 79 bpm  Appended Document: cpx & lab/cbs  Laboratory Results   Urine Tests   Date/Time Reported: December 26, 2008 9:28 AM   Routine Urinalysis   Color: yellow Appearance: Clear Glucose: negative   (Normal Range: Negative) Bilirubin: negative   (Normal Range: Negative) Ketone: negative   (Normal Range: Negative) Spec. Gravity: 1.020   (Normal Range: 1.003-1.035) Blood: negative   (Normal Range: Negative) pH: 5.0   (Normal Range: 5.0-8.0) Protein: negative   (Normal Range: Negative) Urobilinogen: negative   (  Normal Range: 0-1) Nitrite: negative   (Normal Range: Negative) Leukocyte Esterace: negative   (Normal Range: Negative)    Comments: Floydene Flock CMA  December 26, 2008 9:28 AM

## 2010-07-07 NOTE — Assessment & Plan Note (Signed)
Summary: b 12  inj/mhf  Nurse Visit   Allergies: No Known Drug Allergies  Medication Administration  Injection # 1:    Medication: Vit B12 1000 mcg    Diagnosis: OTHER VITAMIN B12 DEFICIENCY ANEMIA (ICD-281.1)    Route: IM    Site: L deltoid    Exp Date: 09/2010    Lot #: [id number]    Mfr: American Regent    Patient tolerated injection without complications    Given by: Floydene Flock CMA (January 15, 2009 9:12 AM)  Orders Added: 1)  Admin of Therapeutic Inj  intramuscular or subcutaneous [96372] 2)  Vit B12 1000 mcg [J3420]   Medication Administration  Injection # 1:    Medication: Vit B12 1000 mcg    Diagnosis: OTHER VITAMIN B12 DEFICIENCY ANEMIA (ICD-281.1)    Route: IM    Site: L deltoid    Exp Date: 09/2010    Lot #: [id number]    Mfr: American Regent    Patient tolerated injection without complications    Given by: Floydene Flock CMA (January 15, 2009 9:12 AM)  Orders Added: 1)  Admin of Therapeutic Inj  intramuscular or subcutaneous [96372] 2)  Vit B12 1000 mcg [J3420]

## 2010-07-09 NOTE — Letter (Signed)
[facility]   [facility] By: Lanelle Bal 06/11/2010 08:17:28  _____________________________________________________________________  External Attachment:    Type:   Image     Comment:   External Document

## 2010-08-28 ENCOUNTER — Other Ambulatory Visit: Payer: Self-pay | Admitting: Family Medicine

## 2010-09-21 ENCOUNTER — Encounter: Payer: Self-pay | Admitting: Family Medicine

## 2010-09-24 ENCOUNTER — Encounter: Payer: Self-pay | Admitting: Family Medicine

## 2010-09-24 ENCOUNTER — Ambulatory Visit (INDEPENDENT_AMBULATORY_CARE_PROVIDER_SITE_OTHER): Payer: BC Managed Care – PPO | Admitting: Family Medicine

## 2010-09-24 VITALS — BP 102/62 | Ht 62.5 in | Wt 128.4 lb

## 2010-09-24 DIAGNOSIS — E785 Hyperlipidemia, unspecified: Secondary | ICD-10-CM

## 2010-09-24 DIAGNOSIS — E039 Hypothyroidism, unspecified: Secondary | ICD-10-CM

## 2010-09-24 DIAGNOSIS — L259 Unspecified contact dermatitis, unspecified cause: Secondary | ICD-10-CM

## 2010-09-24 DIAGNOSIS — J45909 Unspecified asthma, uncomplicated: Secondary | ICD-10-CM

## 2010-09-24 DIAGNOSIS — L309 Dermatitis, unspecified: Secondary | ICD-10-CM

## 2010-09-24 DIAGNOSIS — Z0000 Encounter for general adult medical examination without abnormal findings: Secondary | ICD-10-CM

## 2010-09-24 LAB — TSH: TSH: 2.89 u[IU]/mL (ref 0.35–5.50)

## 2010-09-24 LAB — HEPATIC FUNCTION PANEL
ALT: 16 U/L (ref 0–35)
AST: 21 U/L (ref 0–37)
Albumin: 3.7 g/dL (ref 3.5–5.2)
Alkaline Phosphatase: 51 U/L (ref 39–117)
Bilirubin, Direct: 0.1 mg/dL (ref 0.0–0.3)
Total Bilirubin: 0.8 mg/dL (ref 0.3–1.2)
Total Protein: 7 g/dL (ref 6.0–8.3)

## 2010-09-24 LAB — LIPID PANEL
Cholesterol: 171 mg/dL (ref 0–200)
HDL: 57.1 mg/dL (ref >39.00)
LDL Cholesterol: 100 mg/dL — ABNORMAL HIGH (ref 0–99)
Total CHOL/HDL Ratio: 3
Triglycerides: 69 mg/dL (ref 0.0–149.0)
VLDL: 13.8 mg/dL (ref 0.0–40.0)

## 2010-09-24 LAB — BASIC METABOLIC PANEL
BUN: 13 mg/dL (ref 6–23)
CO2: 29 mEq/L (ref 19–32)
Calcium: 9 mg/dL (ref 8.4–10.5)
Chloride: 100 mEq/L (ref 96–112)
Creatinine, Ser: 0.8 mg/dL (ref 0.4–1.2)
GFR: 83.76 mL/min (ref >60.00)
Glucose, Bld: 66 mg/dL — ABNORMAL LOW (ref 70–99)
Potassium: 4 mEq/L (ref 3.5–5.1)
Sodium: 138 mEq/L (ref 135–145)

## 2010-09-24 MED ORDER — FLUOCINOLONE ACETONIDE 0.01 % EX SHAM: MEDICATED_SHAMPOO | CUTANEOUS | Status: DC

## 2010-09-24 MED ORDER — ALBUTEROL SULFATE HFA 108 (90 BASE) MCG/ACT IN AERS: 2.0000 | INHALATION_SPRAY | Freq: Four times a day (QID) | RESPIRATORY_TRACT | Status: DC | PRN

## 2010-09-24 NOTE — Assessment & Plan Note (Signed)
Check labs con't meds 

## 2010-09-24 NOTE — Assessment & Plan Note (Signed)
Use rx capex Refer to derm if no better

## 2010-09-24 NOTE — Progress Notes (Signed)
  Subjective:     Stacey [name] is a 50 y.o. female and is here for a comprehensive physical exam. The patient reports no problems.  History   Social History  . Marital Status: Married    Spouse Name: N/A    Number of Children: 2  . Years of Education: N/A   Occupational History  .     Social History Main Topics  . Smoking status: Never Smoker   . Smokeless tobacco: Never Used  . Alcohol Use: No  . Drug Use: No  . Sexually Active: Yes -- Female partner(s)   Other Topics Concern  . Not on file   Social History Narrative  . No narrative on file   Health Maintenance  Topic Date Due  . Pap Smear  09/04/1980  . Tetanus/tdap  08/17/2011    The following portions of the patient's history were reviewed and updated as appropriate: allergies, current medications, past family history, past medical history, past social history, past surgical history and problem list.  Review of Systems A comprehensive review of systems was negative.   Objective:    BP 102/62  Ht 5' 2.5" (1.588 m)  Wt 128 lb 6.4 oz (58.242 kg)  BMI 23.11 kg/m2 General appearance: alert, cooperative, appears stated age and no distress Head: Normocephalic, without obvious abnormality, atraumatic Eyes: conjunctivae/corneas clear. PERRL, EOM's intact. Fundi benign. Ears: normal TM's and external ear canals both ears Nose: Nares normal. Septum midline. Mucosa normal. No drainage or sinus tenderness. Throat: lips, mucosa, and tongue normal; teeth and gums normal Neck: no adenopathy, no carotid bruit, no JVD, supple, symmetrical, trachea midline and thyroid not enlarged, symmetric, no tenderness/mass/nodules Lungs: clear to auscultation bilaterally Breasts: normal appearance, no masses or tenderness, gyn--nancy young Heart: regular rate and rhythm, S1, S2 normal, no murmur, click, rub or gallop Abdomen: soft, non-tender; bowel sounds normal; no masses,  no organomegaly Pelvic: gyn -- nancy young Extremities:  extremities normal, atraumatic, no cyanosis or edema Pulses: 2+ and symmetric Skin: Skin color, texture, turgor normal. No rashes or lesions Lymph nodes: Cervical, supraclavicular, and axillary nodes normal. Neurologic: Grossly normal    Assessment:    Healthy female exam.      Plan:     See After Visit Summary for Counseling Recommendations

## 2010-09-25 ENCOUNTER — Telehealth: Payer: Self-pay

## 2010-09-25 NOTE — Telephone Encounter (Signed)
Correction patient needs a CBC-D and UA and it has been scheduled for Tues      KP

## 2010-09-25 NOTE — Telephone Encounter (Signed)
Left message for patient to call the office.    Pt needs to have a sed rate drawn per Dr.Lowne.   When lab orders were put in this order did not appear until after the patient left the office      KP

## 2010-09-28 ENCOUNTER — Encounter: Payer: Self-pay | Admitting: *Deleted

## 2010-09-28 ENCOUNTER — Other Ambulatory Visit (INDEPENDENT_AMBULATORY_CARE_PROVIDER_SITE_OTHER): Payer: BC Managed Care – PPO

## 2010-09-28 DIAGNOSIS — R319 Hematuria, unspecified: Secondary | ICD-10-CM

## 2010-09-28 DIAGNOSIS — Z0000 Encounter for general adult medical examination without abnormal findings: Secondary | ICD-10-CM

## 2010-09-29 LAB — POCT URINALYSIS DIPSTICK
Bilirubin, UA: NEGATIVE
Glucose, UA: NEGATIVE
Ketones, UA: NEGATIVE
Nitrite, UA: NEGATIVE
Protein, UA: NEGATIVE
Spec Grav, UA: 1.03
Urobilinogen, UA: 0.2
pH, UA: 5

## 2010-09-29 LAB — CBC WITH DIFFERENTIAL/PLATELET
Basophils Absolute: 0.1 10*3/uL (ref 0.0–0.1)
Basophils Relative: 0.6 % (ref 0.0–3.0)
Eosinophils Absolute: 0.1 10*3/uL (ref 0.0–0.7)
Eosinophils Relative: 1.3 % (ref 0.0–5.0)
HCT: 43 % (ref 36.0–46.0)
Hemoglobin: 14.8 g/dL (ref 12.0–15.0)
Lymphocytes Relative: 15.1 % (ref 12.0–46.0)
Lymphs Abs: 1.4 10*3/uL (ref 0.7–4.0)
MCHC: 34.3 g/dL (ref 30.0–36.0)
MCV: 96.5 fl (ref 78.0–100.0)
Monocytes Absolute: 0.3 10*3/uL (ref 0.1–1.0)
Monocytes Relative: 3.5 % (ref 3.0–12.0)
Neutro Abs: 7.4 10*3/uL (ref 1.4–7.7)
Neutrophils Relative %: 79.5 % — ABNORMAL HIGH (ref 43.0–77.0)
Platelets: 303 10*3/uL (ref 150.0–400.0)
RBC: 4.46 Mil/uL (ref 3.87–5.11)
RDW: 12.5 % (ref 11.5–14.6)
WBC: 9.4 10*3/uL (ref 4.5–10.5)

## 2010-09-30 ENCOUNTER — Encounter: Payer: Self-pay | Admitting: *Deleted

## 2010-10-01 LAB — URINE CULTURE: Colony Count: 6000

## 2010-10-23 NOTE — Assessment & Plan Note (Signed)
[facility]                                   ON-CALL NOTE   Stacey, [name]                         MRN:          [id number]  DATE:01/05/2006                            DOB:          05/19/1961    CHIEF COMPLAINT:  Cat bite.   HISTORY OF PRESENT ILLNESS:  The patient states that she rescued a kitten  recently.  It was hiding under her car.  She picked it up today, and it was  not domesticated.  It bit and scratched her several places on her arm.  No  where deep and no where is bleeding.  She wants to know what to do next.  She does still have the cat.  The cat is in her garage.  I told her to call  Animal Control to come safely get the cat to the vet for evaluation for  rabies and to also wash her wound in soap and water as sterilely as she can,  use a bit of antibiotic ointment tonight and to contact the office at 8:30  when it opens in the morning to make and appointment to be seen.  She may or  may not need a tetanus shot, and she may need prophylactic antibiotics  pending on the degree of the injuries.  If she begins to feel sick or fever  or other symptoms tonight, she will call back.                                   Marne A. Tower, MD   MAT/MedQ  DD:  01/05/2006  DT:  01/06/2006  Job #:  [id number]   cc:   Lelon Perla, DO

## 2010-10-28 ENCOUNTER — Other Ambulatory Visit: Payer: Self-pay

## 2010-10-28 MED ORDER — SIMVASTATIN 40 MG PO TABS: 40.0000 mg | ORAL_TABLET | Freq: Every day | ORAL | Status: DC

## 2010-11-03 ENCOUNTER — Other Ambulatory Visit: Payer: Self-pay | Admitting: Family Medicine

## 2010-11-03 MED ORDER — SIMVASTATIN 40 MG PO TABS: 40.0000 mg | ORAL_TABLET | Freq: Every day | ORAL | Status: DC

## 2010-11-03 NOTE — Telephone Encounter (Signed)
Done

## 2011-01-28 ENCOUNTER — Other Ambulatory Visit: Payer: Self-pay | Admitting: Family Medicine

## 2011-02-09 ENCOUNTER — Encounter: Payer: Self-pay | Admitting: Family Medicine

## 2011-04-22 ENCOUNTER — Other Ambulatory Visit: Payer: Self-pay | Admitting: Family Medicine

## 2011-04-22 DIAGNOSIS — Z20828 Contact with and (suspected) exposure to other viral communicable diseases: Secondary | ICD-10-CM

## 2011-04-22 MED ORDER — OSELTAMIVIR PHOSPHATE 75 MG PO CAPS
ORAL_CAPSULE | ORAL | Status: DC
Start: 2011-04-22 — End: 2011-05-11

## 2011-05-11 ENCOUNTER — Ambulatory Visit (INDEPENDENT_AMBULATORY_CARE_PROVIDER_SITE_OTHER): Payer: BC Managed Care – PPO | Admitting: Family

## 2011-05-11 ENCOUNTER — Encounter: Payer: Self-pay | Admitting: Family

## 2011-05-11 DIAGNOSIS — L259 Unspecified contact dermatitis, unspecified cause: Secondary | ICD-10-CM

## 2011-05-11 DIAGNOSIS — L309 Dermatitis, unspecified: Secondary | ICD-10-CM

## 2011-05-11 MED ORDER — HYDROCORTISONE 2.5 % EX OINT: TOPICAL_OINTMENT | Freq: Two times a day (BID) | CUTANEOUS | Status: DC

## 2011-05-11 NOTE — Patient Instructions (Signed)
Apply lubriderm daily to your skin.  Call if your symptoms worsen or if they do not improve.

## 2011-05-11 NOTE — Progress Notes (Signed)
  Subjective:    Patient ID: Stacey [name], female    DOB: November 27, 1960, 50 y.o.   MRN: [id number]  HPI  Stacey [name] is a 50 yr old female who presents today with chief complaint of rash.  She reports that the rash started >3 months ago.  Initially she noticed it at the base of her neck.  Tried a medicated shampoo without improvement.  Now she has rash on her inner thigh, arm pits, belly and behind both knees. She feels that the rash is now getting worse.  She is using zyrtec D. She has changed all of her detergents to the unscented/clear versions.    Review of Systems See HPI    Objective:   Physical Exam  Constitutional: She appears well-developed and well-nourished. No distress.  Skin:       Excoriated rash noted on both upper thighs, abdomen.  Also noted at base of neck, bilateral AC fossa and behind both knees.  Skin is very dry appearing          Assessment & Plan:

## 2011-05-12 NOTE — Assessment & Plan Note (Signed)
Symptoms most consistent with eczema.  I advised pt to try not to shower ever day.  Add a humidifier to the home if possible.  Apply a good moisturizer generously daily to the skin such as lubriderm.  Apply hydrocortisone cream bid to affected areas as needed. She has an apt with Dermatology on Monday.

## 2011-06-04 ENCOUNTER — Telehealth: Payer: Self-pay | Admitting: Family Medicine

## 2011-06-04 NOTE — Telephone Encounter (Signed)
Patient states that she saw Stacey [name] in early December for a rash but would like to have allergy testing done also.

## 2011-06-04 NOTE — Telephone Encounter (Signed)
Patient made appt on 06/09/11 with Dr. Laury Axon

## 2011-06-04 NOTE — Telephone Encounter (Signed)
She will need and apt to see Dr.Lowne. Could you schedule her for when Dr.Lowne is back in town please. Thank You    KP

## 2011-06-09 ENCOUNTER — Encounter: Payer: Self-pay | Admitting: Family Medicine

## 2011-06-09 ENCOUNTER — Ambulatory Visit (INDEPENDENT_AMBULATORY_CARE_PROVIDER_SITE_OTHER): Payer: BC Managed Care – PPO | Admitting: Family Medicine

## 2011-06-09 DIAGNOSIS — L509 Urticaria, unspecified: Secondary | ICD-10-CM

## 2011-06-09 DIAGNOSIS — J309 Allergic rhinitis, unspecified: Secondary | ICD-10-CM

## 2011-06-09 MED ORDER — MOMETASONE FUROATE 50 MCG/ACT NA SUSP: 2.0000 | Freq: Every day | NASAL | Status: DC

## 2011-06-09 MED ORDER — PREDNISONE 10 MG PO TABS: 10.0000 mg | ORAL_TABLET | Freq: Every day | ORAL | Status: DC

## 2011-06-09 NOTE — Patient Instructions (Signed)
Hives Hives (urticaria) are itchy, red, swollen patches on the skin. They may change size, shape, and location quickly and repeatedly. Hives that occur deeper in the skin can cause swelling of the hands, feet, and face. Hives may be an allergic reaction to something you or your child ate, touched, or put on the skin. Hives can also be a reaction to cold, heat, viral infections, medication, insect bites, or emotional stress. Often the cause is hard to find. Hives can come and go for several days to several weeks. Hives are not contagious. HOME CARE INSTRUCTIONS   If the cause of the hives is known, avoid exposure to that source.   To relieve itching and rash:   Apply cold compresses to the skin or take cool water baths. Do not take or give your child hot baths or showers because the warmth will make the itching worse.   The best medicine for hives is an antihistamine. An antihistamine will not cure hives, but it will reduce their severity. You can use an antihistamine available over the counter. This medicine may make your child sleepy. Teenagers should not drive while using this medicine.   Take or give an antihistamine every 6 hours until the hives are completely gone for 24 hours or as directed.   Your child may have other medications prescribed for itching. Give these as directed by your child's caregiver.   You or your child should wear loose fitting clothing, including undergarments. Skin irritations may make hives worse.   Follow-up as directed by your caregiver.  SEEK MEDICAL CARE IF:   You or your child still have considerable itching after taking the medication (prescribed or purchased over the counter).   Joint swelling or pain occurs.  SEEK IMMEDIATE MEDICAL CARE IF:   You have a fever.   Swollen lips or tongue are noticed.   There is difficulty with breathing, swallowing, or tightness in the throat or chest.   Abdominal pain develops.   Your child starts acting very  sick.  These may be the first signs of a life-threatening allergic reaction. THIS IS AN EMERGENCY. Call 911 for medical help. MAKE SURE YOU:   Understand these instructions.   Will watch your condition.   Will get help right away if you are not doing well or get worse.  Document Released: 05/24/2005 Document Revised: 02/03/2011 Document Reviewed: 01/12/2008 ExitCare Patient Information 2012 [employer]. 

## 2011-06-09 NOTE — Progress Notes (Signed)
  Subjective:     Stacey [name] is a 51 y.o. female who presents for evaluation of a rash involving the arms, legs and trunk. Rash started several weeks  ago. Lesions are errythematous, and raised in texture. Rash has changed over time. Rash is macular. Associated symptoms: none Patient denies: fever, joint pains etc. Patient has not had contacts with similar rash. Patient has not had new exposures (soaps, lotions, laundry detergents, foods, medications, plants, insects or animal  Past hx reviewed Review of Systems Review of Systems  Constitutional: Negative for activity change, appetite change and fatigue.  HENT: Negative for hearing loss, tinnitus and ear discharge.  + congestion Eyes: Negative for visual disturbance   Respiratory: Negative for cough, chest tightness and shortness of breath.   Cardiovascular: Negative for chest pain, palpitations and leg swelling.  Gastrointestinal: Negative for abdominal pain, diarrhea, constipation and abdominal distention.  Genitourinary: Negative for urgency, frequency, decreased urine volume and difficulty urinating.  Musculoskeletal: Negative for back pain, arthralgias and gait problem.  Skin: Negative for color change, pallor and rash.  Neurological: Negative for dizziness, light-headedness, numbness and headaches.  Hematological: Negative for adenopathy. Does not bruise/bleed easily.  Psychiatric/Behavioral: Negative for suicidal ideas, confusion, sleep disturbance, self-injury, dysphoric mood, decreased concentration and agitation.       Objective:    BP 112/74  Pulse 97  Temp(Src) 98.2 F (36.8 C) (Oral)  Wt 134 lb 3.2 oz (60.873 kg)  SpO2 99%  LMP 04/11/2011 General:  AAOX3,  NAD  Skin:    + macular,  Errythematous,  pruritic     Assessment:    hives   allergic rhinitis---con't zyrtec and add nasonex Plan:    Medications: steroids: pred taper and zyrtec. verbal and written instructions given patient instruction given. \ Refer  allergist

## 2011-07-06 ENCOUNTER — Other Ambulatory Visit: Payer: Self-pay | Admitting: Women's Health

## 2011-07-11 ENCOUNTER — Other Ambulatory Visit: Payer: Self-pay | Admitting: Women's Health

## 2011-09-07 ENCOUNTER — Telehealth: Payer: Self-pay | Admitting: *Deleted

## 2011-09-07 NOTE — Telephone Encounter (Signed)
Wanted to get Stacey [name]'s DMV forms completed and she will drop them off tomorrow.     KP

## 2011-09-07 NOTE — Telephone Encounter (Signed)
Pt left vm stating that she wanted a call back to ask a few questions, no further information noted

## 2011-09-10 ENCOUNTER — Ambulatory Visit (INDEPENDENT_AMBULATORY_CARE_PROVIDER_SITE_OTHER): Payer: BC Managed Care – PPO | Admitting: Women's Health

## 2011-09-10 ENCOUNTER — Encounter: Payer: Self-pay | Admitting: Women's Health

## 2011-09-10 ENCOUNTER — Other Ambulatory Visit (HOSPITAL_COMMUNITY)
Admission: RE | Admit: 2011-09-10 | Discharge: 2011-09-10 | Disposition: A | Discharge status: Home / Self Care | Payer: BC Managed Care – PPO | Source: Ambulatory Visit | Attending: Obstetrics and Gynecology | Admitting: Obstetrics and Gynecology

## 2011-09-10 VITALS — BP 118/80 | Ht 62.0 in | Wt 129.5 lb

## 2011-09-10 DIAGNOSIS — Z01419 Encounter for gynecological examination (general) (routine) without abnormal findings: Secondary | ICD-10-CM

## 2011-09-10 NOTE — Progress Notes (Signed)
Stacey [name] January 13, 1961 [id number]    History:    The patient presents for annual exam.  Stopped seasonale in February, has not had a cycle since but feels like she is ready to start. Using condoms for contraception. History of normal mammograms. History of oh LGSIL in 2006 with normal Paps after. Hypothyroid and hypercholesteremia primary care labs and meds.   Past medical history, past surgical history, family history and social history were all reviewed and documented in the EPIC chart. Both daughters with type 1 diabetes. Daughter Judeth Cornfield also with ciliac disease with an autoimmune disease CIMD causing eye problems and extreme weakness  of muscles. Is being seen at [facility] and doing better.   ROS:  A  ROS was performed and pertinent positives and negatives are included in the history.  Exam:  There were no vitals filed for this visit.  General appearance:  Normal Head/Neck:  Normal, without cervical or supraclavicular adenopathy. Thyroid:  Symmetrical, normal in size, without palpable masses or nodularity. Respiratory  Effort:  Normal  Auscultation:  Clear without wheezing or rhonchi Cardiovascular  Auscultation:  Regular rate, without rubs, murmurs or gallops  Edema/varicosities:  Not grossly evident Abdominal  Soft,nontender, without masses, guarding or rebound.  Liver/spleen:  No organomegaly noted  Hernia:  None appreciated  Skin  Inspection:  Grossly normal  Palpation:  Grossly normal Neurologic/psychiatric  Orientation:  Normal with appropriate conversation.  Mood/affect:  Normal  Genitourinary    Breasts: Examined lying and sitting.     Right: Without masses, retractions, discharge or axillary adenopathy.     Left: Without masses, retractions, discharge or axillary adenopathy.   Inguinal/mons:  Normal without inguinal adenopathy  External genitalia:  Normal  BUS/Urethra/Skene's glands:  Normal  Bladder:  Normal  Vagina:  Normal  Cervix:   Normal  Uterus:   normal in size, shape and contour.  Midline and mobile  Adnexa/parametria:     Rt: Without masses or tenderness.   Lt: Without masses or tenderness.  Anus and perineum: Normal  Digital rectal exam: Normal sphincter tone without palpated masses or tenderness  Assessment/Plan:  51 y.o. MWF G2 P2 for annual exam with no complaints.  History of LGSIL in 2006 normal Paps after. Hyperthyroid/hypercholesteremia-primary care labs and meds  Plan: Continue condoms for contraception, return to office if no cycle in 1 month. Had been on Seasonale for many years stopped in February. Reviewed importance if not menopausal need to continue to have cycle atleast every 3 months. SBE's, annual mammogram, calcium rich diet, vitamin D 1000 daily encouraged. Continue healthy lifestyle of exercise and diet. Pap only.   Harrington Challenger [facility], 9:41 AM 09/10/2011

## 2011-09-10 NOTE — Patient Instructions (Signed)

## 2011-12-12 ENCOUNTER — Other Ambulatory Visit: Payer: Self-pay | Admitting: Family Medicine

## 2012-01-23 ENCOUNTER — Other Ambulatory Visit: Payer: Self-pay | Admitting: Family Medicine

## 2012-02-03 ENCOUNTER — Ambulatory Visit (INDEPENDENT_AMBULATORY_CARE_PROVIDER_SITE_OTHER): Payer: BC Managed Care – PPO | Admitting: Family Medicine

## 2012-02-03 ENCOUNTER — Encounter: Payer: Self-pay | Admitting: Family Medicine

## 2012-02-03 ENCOUNTER — Telehealth: Payer: Self-pay | Admitting: Family Medicine

## 2012-02-03 VITALS — BP 104/62 | HR 91 | Temp 98.5°F | Wt 121.6 lb

## 2012-02-03 DIAGNOSIS — S0180XA Unspecified open wound of other part of head, initial encounter: Secondary | ICD-10-CM

## 2012-02-03 DIAGNOSIS — W540XXA Bitten by dog, initial encounter: Secondary | ICD-10-CM | POA: Insufficient documentation

## 2012-02-03 DIAGNOSIS — S0185XA Open bite of other part of head, initial encounter: Secondary | ICD-10-CM

## 2012-02-03 DIAGNOSIS — T148XXA Other injury of unspecified body region, initial encounter: Secondary | ICD-10-CM

## 2012-02-03 MED ORDER — AMOXICILLIN-POT CLAVULANATE 875-125 MG PO TABS: 1.0000 | ORAL_TABLET | Freq: Two times a day (BID) | ORAL | Status: AC

## 2012-02-03 NOTE — Telephone Encounter (Signed)
Scheduled with Dr.Lowne at 4pm

## 2012-02-03 NOTE — Patient Instructions (Addendum)
Animal Bite  An animal bite can result in a scratch on the skin, deep open cut, puncture of the skin, crush injury, or tearing away of the skin or a body part. Dogs are responsible for most animal bites. Children are bitten more often than adults. An animal bite can range from very mild to more serious. A small bite from your house pet is no cause for alarm. However, some animal bites can become infected or injure a bone or other tissue. You must seek medical care if:  · The skin is broken and bleeding does not slow down or stop after 15 minutes.  · The puncture is deep and difficult to clean (such as a cat bite).  · Pain, warmth, redness, or pus develops around the wound.  · The bite is from a stray animal or rodent. There may be a risk of rabies infection.  · The bite is from a snake, raccoon, skunk, fox, coyote, or bat. There may be a risk of rabies infection.  · The person bitten has a chronic illness such as diabetes, liver disease, or cancer, or the person takes medicine that lowers the immune system.  · There is concern about the location and severity of the bite.  It is important to clean and protect an animal bite wound right away to prevent infection. Follow these steps:  · Clean the wound with plenty of water and soap.  · Apply an antibiotic cream.  · Apply gentle pressure over the wound with a clean towel or gauze to slow or stop bleeding.  · Elevate the affected area above the heart to help stop any bleeding.  · Seek medical care. Getting medical care within 8 hours of the animal bite leads to the best possible outcome.  DIAGNOSIS   Your caregiver will most likely:  · Take a detailed history of the animal and the bite injury.  · Perform a wound exam.  · Take your medical history.  Blood tests or X-rays may be performed. Sometimes, infected bite wounds are cultured and sent to a lab to identify the infectious bacteria.   TREATMENT   Medical treatment will depend on the location and type of animal bite as  well as the patient's medical history. Treatment may include:  · Wound care, such as cleaning and flushing the wound with saline solution, bandaging, and elevating the affected area.  · Antibiotics.  · Tetanus immunization.  · Rabies immunization.  · Leaving the wound open to heal. This is often done with animal bites, due to the high risk of infection. However, in certain cases, wound closure with stitches, wound adhesive, skin adhesive strips, or staples may be used.   Infected bites that are left untreated may require intravenous (IV) antibiotics and surgical treatment in the hospital.  HOME CARE INSTRUCTIONS  · Follow your caregiver's instructions for wound care.  · Take all medicines as directed.  · If your caregiver prescribes antibiotics, take them as directed. Finish them even if you start to feel better.  · Follow up with your caregiver for further exams or immunizations as directed.  You may need a tetanus shot if:  · You cannot remember when you had your last tetanus shot.  · You have never had a tetanus shot.  · The injury broke your skin.  If you get a tetanus shot, your arm may swell, get red, and feel warm to the touch. This is common and not a problem. If you need a tetanus   shot and you choose not to have one, there is a rare chance of getting tetanus. Sickness from tetanus can be serious.  SEEK MEDICAL CARE IF:  · You notice warmth, redness, soreness, swelling, pus discharge, or a bad smell coming from the wound.  · You have a red line on the skin coming from the wound.  · You have a fever, chills, or a general ill feeling.  · You have nausea or vomiting.  · You have continued or worsening pain.  · You have trouble moving the injured part.  · You have other questions or concerns.  MAKE SURE YOU:  · Understand these instructions.  · Will watch your condition.  · Will get help right away if you are not doing well or get worse.  Document Released: 02/09/2011 Document Revised: 05/13/2011 Document  Reviewed: 02/09/2011  Exit[employer]® Patient Information ©2012 [employer].

## 2012-02-03 NOTE — Progress Notes (Signed)
  Subjective:    Patient ID: Fritzi Mandes, female    DOB: [birth year], 51 y.o.   MRN: [id number]  HPI Pt here c/o dog bite today.  There was a stray dog with a collar on in the neighborhood for the last week.  Pt tried to catch him with a towel and he bit her in the chin.  Unsure of dogs rabies status.  Animal control did catch dog and they will watch him for 10 days.   No other complaints.  Pt reported incident already.  Review of Systems As above    Objective:   Physical Exam  Constitutional: She appears well-developed and well-nourished.  Neurological: She is alert.  Skin:       Chin---bite marks,  No laceration,  No signs of infection  Psychiatric: She has a normal mood and affect. Her behavior is normal. Judgment and thought content normal.          Assessment & Plan:

## 2012-02-03 NOTE — Telephone Encounter (Signed)
Caller: Raveena/Patient; Patient Name: Stacey [name]; PCP: Lelon Perla.; Best Callback Phone Number: [phone]; Calling today 02/03/12 regarding was bitten on chin by stray dog today around 3 PM. Dog catcher did come and filled out a [employer] form #201 investigation.  They have not captured the dog, a trap was set. Has 3 puncture sites on chin.  Emergent symptoms ruled out by Bites - Animal - Or Human guidelines with exception of any full thickness puncture wound.  (See in ED immediately).  OFFICE PLEASE CALL PATIENT BACK AT ABOVE CALL BACK NUMBER AS SOON AS POSSIBLE TO LET HER KNOW IF SHE CAN COME TO OFFICE OR IF SHE WOULD NEED TO GO TO ED ANYWAY BECAUSE OF POSSIBLE RABIES EXPOSURE.

## 2012-02-03 NOTE — Assessment & Plan Note (Signed)
Pt already reported incident augmentin 875 bid Wound cleaned Tetanus utd Dog is in quarantine for 10 days--- pt aware if rabies treatment needed she will need to go to ER

## 2012-02-16 ENCOUNTER — Other Ambulatory Visit: Payer: Self-pay | Admitting: Family Medicine

## 2012-02-29 ENCOUNTER — Other Ambulatory Visit: Payer: Self-pay | Admitting: Family Medicine

## 2012-02-29 NOTE — Telephone Encounter (Signed)
Rx sent.    MW 

## 2012-03-20 ENCOUNTER — Other Ambulatory Visit: Payer: Self-pay | Admitting: Family Medicine

## 2012-03-20 NOTE — Telephone Encounter (Signed)
Rx sent.    MW 

## 2012-03-21 NOTE — Telephone Encounter (Signed)
OV 02/03/12. Look like you never Rx this med before its a historical med.  PLz advise    MW

## 2012-03-22 ENCOUNTER — Encounter: Payer: Self-pay | Admitting: Family Medicine

## 2012-04-19 ENCOUNTER — Other Ambulatory Visit: Payer: Self-pay | Admitting: Family Medicine

## 2012-04-19 NOTE — Telephone Encounter (Signed)
Rx sent.    MW 

## 2012-04-26 ENCOUNTER — Ambulatory Visit (INDEPENDENT_AMBULATORY_CARE_PROVIDER_SITE_OTHER): Payer: BC Managed Care – PPO | Admitting: Family Medicine

## 2012-04-26 ENCOUNTER — Encounter: Payer: Self-pay | Admitting: Family Medicine

## 2012-04-26 VITALS — BP 102/60 | HR 80 | Temp 97.8°F | Ht 62.5 in | Wt 121.6 lb

## 2012-04-26 DIAGNOSIS — M25519 Pain in unspecified shoulder: Secondary | ICD-10-CM

## 2012-04-26 DIAGNOSIS — E039 Hypothyroidism, unspecified: Secondary | ICD-10-CM

## 2012-04-26 DIAGNOSIS — E785 Hyperlipidemia, unspecified: Secondary | ICD-10-CM

## 2012-04-26 DIAGNOSIS — M25512 Pain in left shoulder: Secondary | ICD-10-CM

## 2012-04-26 DIAGNOSIS — Z0000 Encounter for general adult medical examination without abnormal findings: Secondary | ICD-10-CM

## 2012-04-26 LAB — BASIC METABOLIC PANEL
BUN: 13 mg/dL (ref 6–23)
CO2: 32 mEq/L (ref 19–32)
Calcium: 9 mg/dL (ref 8.4–10.5)
Chloride: 101 mEq/L (ref 96–112)
Creatinine, Ser: 0.7 mg/dL (ref 0.4–1.2)
GFR: 91.26 mL/min (ref >60.00)
Glucose, Bld: 90 mg/dL (ref 70–99)
Potassium: 4 mEq/L (ref 3.5–5.1)
Sodium: 137 mEq/L (ref 135–145)

## 2012-04-26 LAB — CBC WITH DIFFERENTIAL/PLATELET
Basophils Absolute: 0 10*3/uL (ref 0.0–0.1)
Basophils Relative: 0.6 % (ref 0.0–3.0)
Eosinophils Absolute: 0.1 10*3/uL (ref 0.0–0.7)
Eosinophils Relative: 1.7 % (ref 0.0–5.0)
HCT: 45.6 % (ref 36.0–46.0)
Hemoglobin: 14.9 g/dL (ref 12.0–15.0)
Lymphocytes Relative: 28.3 % (ref 12.0–46.0)
Lymphs Abs: 1.3 10*3/uL (ref 0.7–4.0)
MCHC: 32.7 g/dL (ref 30.0–36.0)
MCV: 94.8 fl (ref 78.0–100.0)
Monocytes Absolute: 0.3 10*3/uL (ref 0.1–1.0)
Monocytes Relative: 6.2 % (ref 3.0–12.0)
Neutro Abs: 2.9 10*3/uL (ref 1.4–7.7)
Neutrophils Relative %: 63.2 % (ref 43.0–77.0)
Platelets: 242 10*3/uL (ref 150.0–400.0)
RBC: 4.81 Mil/uL (ref 3.87–5.11)
RDW: 12.6 % (ref 11.5–14.6)
WBC: 4.6 10*3/uL (ref 4.5–10.5)

## 2012-04-26 LAB — LIPID PANEL
Cholesterol: 165 mg/dL (ref 0–200)
HDL: 72.4 mg/dL (ref >39.00)
LDL Cholesterol: 78 mg/dL (ref 0–99)
Total CHOL/HDL Ratio: 2
Triglycerides: 72 mg/dL (ref 0.0–149.0)
VLDL: 14.4 mg/dL (ref 0.0–40.0)

## 2012-04-26 LAB — HEPATIC FUNCTION PANEL
ALT: 20 U/L (ref 0–35)
AST: 26 U/L (ref 0–37)
Albumin: 4.1 g/dL (ref 3.5–5.2)
Alkaline Phosphatase: 78 U/L (ref 39–117)
Bilirubin, Direct: 0.1 mg/dL (ref 0.0–0.3)
Total Bilirubin: 0.9 mg/dL (ref 0.3–1.2)
Total Protein: 7.4 g/dL (ref 6.0–8.3)

## 2012-04-26 LAB — VITAMIN B12: Vitamin B-12: 987 pg/mL — ABNORMAL HIGH (ref 211–911)

## 2012-04-26 LAB — POCT URINALYSIS DIPSTICK
Bilirubin, UA: NEGATIVE
Blood, UA: NEGATIVE
Glucose, UA: NEGATIVE
Ketones, UA: NEGATIVE
Leukocytes, UA: NEGATIVE
Nitrite, UA: NEGATIVE
Protein, UA: NEGATIVE
Spec Grav, UA: 1.02
Urobilinogen, UA: 0.2
pH, UA: 7.5

## 2012-04-26 LAB — TSH: TSH: 5.95 u[IU]/mL — ABNORMAL HIGH (ref 0.35–5.50)

## 2012-04-26 NOTE — Patient Instructions (Addendum)
Preventive Care for Adults, Female A healthy lifestyle and preventive care can promote health and wellness. Preventive health guidelines for women include the following key practices.  A routine yearly physical is a good way to check with your caregiver about your health and preventive screening. It is a chance to share any concerns and updates on your health, and to receive a thorough exam.  Visit your dentist for a routine exam and preventive care every 6 months. Brush your teeth twice a day and floss once a day. Good oral hygiene prevents tooth decay and gum disease.  The frequency of eye exams is based on your age, health, family medical history, use of contact lenses, and other factors. Follow your caregiver's recommendations for frequency of eye exams.  Eat a healthy diet. Foods like vegetables, fruits, whole grains, low-fat dairy products, and lean protein foods contain the nutrients you need without too many calories. Decrease your intake of foods high in solid fats, added sugars, and salt. Eat the right amount of calories for you.Get information about a proper diet from your caregiver, if necessary.  Regular physical exercise is one of the most important things you can do for your health. Most adults should get at least 150 minutes of moderate-intensity exercise (any activity that increases your heart rate and causes you to sweat) each week. In addition, most adults need muscle-strengthening exercises on 2 or more days a week.  Maintain a healthy weight. The body mass index (BMI) is a screening tool to identify possible weight problems. It provides an estimate of body fat based on height and weight. Your caregiver can help determine your BMI, and can help you achieve or maintain a healthy weight.For adults 20 years and older:  A BMI below 18.5 is considered underweight.  A BMI of 18.5 to 24.9 is normal.  A BMI of 25 to 29.9 is considered overweight.  A BMI of 30 and above is  considered obese.  Maintain normal blood lipids and cholesterol levels by exercising and minimizing your intake of saturated fat. Eat a balanced diet with plenty of fruit and vegetables. Blood tests for lipids and cholesterol should begin at age 20 and be repeated every 5 years. If your lipid or cholesterol levels are high, you are over 50, or you are at high risk for heart disease, you may need your cholesterol levels checked more frequently.Ongoing high lipid and cholesterol levels should be treated with medicines if diet and exercise are not effective.  If you smoke, find out from your caregiver how to quit. If you do not use tobacco, do not start.  If you are pregnant, do not drink alcohol. If you are breastfeeding, be very cautious about drinking alcohol. If you are not pregnant and choose to drink alcohol, do not exceed 1 drink per day. One drink is considered to be 12 ounces (355 mL) of beer, 5 ounces (148 mL) of wine, or 1.5 ounces (44 mL) of liquor.  Avoid use of street drugs. Do not share needles with anyone. Ask for help if you need support or instructions about stopping the use of drugs.  High blood pressure causes heart disease and increases the risk of stroke. Your blood pressure should be checked at least every 1 to 2 years. Ongoing high blood pressure should be treated with medicines if weight loss and exercise are not effective.  If you are 55 to 49 years old, ask your caregiver if you should take aspirin to prevent strokes.  Diabetes   screening involves taking a blood sample to check your fasting blood sugar level. This should be done once every 3 years, after age 45, if you are within normal weight and without risk factors for diabetes. Testing should be considered at a younger age or be carried out more frequently if you are overweight and have at least 1 risk factor for diabetes.  Breast cancer screening is essential preventive care for women. You should practice "breast  self-awareness." This means understanding the normal appearance and feel of your breasts and may include breast self-examination. Any changes detected, no matter how small, should be reported to a caregiver. Women in their 20s and 30s should have a clinical breast exam (CBE) by a caregiver as part of a regular health exam every 1 to 3 years. After age 40, women should have a CBE every year. Starting at age 40, women should consider having a mammography (breast X-ray test) every year. Women who have a family history of breast cancer should talk to their caregiver about genetic screening. Women at a high risk of breast cancer should talk to their caregivers about having magnetic resonance imaging (MRI) and a mammography every year.  The Pap test is a screening test for cervical cancer. A Pap test can show cell changes on the cervix that might become cervical cancer if left untreated. A Pap test is a procedure in which cells are obtained and examined from the lower end of the uterus (cervix).  Women should have a Pap test starting at age 21.  Between ages 21 and 29, Pap tests should be repeated every 2 years.  Beginning at age 30, you should have a Pap test every 3 years as long as the past 3 Pap tests have been normal.  Some women have medical problems that increase the chance of getting cervical cancer. Talk to your caregiver about these problems. It is especially important to talk to your caregiver if a new problem develops soon after your last Pap test. In these cases, your caregiver may recommend more frequent screening and Pap tests.  The above recommendations are the same for women who have or have not gotten the vaccine for human papillomavirus (HPV).  If you had a hysterectomy for a problem that was not cancer or a condition that could lead to cancer, then you no longer need Pap tests. Even if you no longer need a Pap test, a regular exam is a good idea to make sure no other problems are  starting.  If you are between ages 65 and 70, and you have had normal Pap tests going back 10 years, you no longer need Pap tests. Even if you no longer need a Pap test, a regular exam is a good idea to make sure no other problems are starting.  If you have had past treatment for cervical cancer or a condition that could lead to cancer, you need Pap tests and screening for cancer for at least 20 years after your treatment.  If Pap tests have been discontinued, risk factors (such as a new sexual partner) need to be reassessed to determine if screening should be resumed.  The HPV test is an additional test that may be used for cervical cancer screening. The HPV test looks for the virus that can cause the cell changes on the cervix. The cells collected during the Pap test can be tested for HPV. The HPV test could be used to screen women aged 30 years and older, and should   be used in women of any age who have unclear Pap test results. After the age of 30, women should have HPV testing at the same frequency as a Pap test.  Colorectal cancer can be detected and often prevented. Most routine colorectal cancer screening begins at the age of 50 and continues through age 75. However, your caregiver may recommend screening at an earlier age if you have risk factors for colon cancer. On a yearly basis, your caregiver may provide home test kits to check for hidden blood in the stool. Use of a small camera at the end of a tube, to directly examine the colon (sigmoidoscopy or colonoscopy), can detect the earliest forms of colorectal cancer. Talk to your caregiver about this at age 50, when routine screening begins. Direct examination of the colon should be repeated every 5 to 10 years through age 75, unless early forms of pre-cancerous polyps or small growths are found.  Hepatitis C blood testing is recommended for all people born from 1945 through 1965 and any individual with known risks for hepatitis C.  Practice  safe sex. Use condoms and avoid high-risk sexual practices to reduce the spread of sexually transmitted infections (STIs). STIs include gonorrhea, chlamydia, syphilis, trichomonas, herpes, HPV, and human immunodeficiency virus (HIV). Herpes, HIV, and HPV are viral illnesses that have no cure. They can result in disability, cancer, and death. Sexually active women aged 25 and younger should be checked for chlamydia. Older women with new or multiple partners should also be tested for chlamydia. Testing for other STIs is recommended if you are sexually active and at increased risk.  Osteoporosis is a disease in which the bones lose minerals and strength with aging. This can result in serious bone fractures. The risk of osteoporosis can be identified using a bone density scan. Women ages 65 and over and women at risk for fractures or osteoporosis should discuss screening with their caregivers. Ask your caregiver whether you should take a calcium supplement or vitamin D to reduce the rate of osteoporosis.  Menopause can be associated with physical symptoms and risks. Hormone replacement therapy is available to decrease symptoms and risks. You should talk to your caregiver about whether hormone replacement therapy is right for you.  Use sunscreen with sun protection factor (SPF) of 30 or more. Apply sunscreen liberally and repeatedly throughout the day. You should seek shade when your shadow is shorter than you. Protect yourself by wearing long sleeves, pants, a wide-brimmed hat, and sunglasses year round, whenever you are outdoors.  Once a month, do a whole body skin exam, using a mirror to look at the skin on your back. Notify your caregiver of new moles, moles that have irregular borders, moles that are larger than a pencil eraser, or moles that have changed in shape or color.  Stay current with required immunizations.  Influenza. You need a dose every fall (or winter). The composition of the flu vaccine  changes each year, so being vaccinated once is not enough.  Pneumococcal polysaccharide. You need 1 to 2 doses if you smoke cigarettes or if you have certain chronic medical conditions. You need 1 dose at age 65 (or older) if you have never been vaccinated.  Tetanus, diphtheria, pertussis (Tdap, Td). Get 1 dose of Tdap vaccine if you are younger than age 65, are over 65 and have contact with an infant, are a healthcare worker, are pregnant, or simply want to be protected from whooping cough. After that, you need a Td   booster dose every 10 years. Consult your caregiver if you have not had at least 3 tetanus and diphtheria-containing shots sometime in your life or have a deep or dirty wound.  HPV. You need this vaccine if you are a woman age 26 or younger. The vaccine is given in 3 doses over 6 months.  Measles, mumps, rubella (MMR). You need at least 1 dose of MMR if you were born in 1957 or later. You may also need a second dose.  Meningococcal. If you are age 19 to 21 and a first-year college student living in a residence hall, or have one of several medical conditions, you need to get vaccinated against meningococcal disease. You may also need additional booster doses.  Zoster (shingles). If you are age 60 or older, you should get this vaccine.  Varicella (chickenpox). If you have never had chickenpox or you were vaccinated but received only 1 dose, talk to your caregiver to find out if you need this vaccine.  Hepatitis A. You need this vaccine if you have a specific risk factor for hepatitis A virus infection or you simply wish to be protected from this disease. The vaccine is usually given as 2 doses, 6 to 18 months apart.  Hepatitis B. You need this vaccine if you have a specific risk factor for hepatitis B virus infection or you simply wish to be protected from this disease. The vaccine is given in 3 doses, usually over 6 months. Preventive Services / Frequency Ages 19 to 39  Blood  pressure check.** / Every 1 to 2 years.  Lipid and cholesterol check.** / Every 5 years beginning at age 20.  Clinical breast exam.** / Every 3 years for women in their 20s and 30s.  Pap test.** / Every 2 years from ages 21 through 29. Every 3 years starting at age 30 through age 65 or 70 with a history of 3 consecutive normal Pap tests.  HPV screening.** / Every 3 years from ages 30 through ages 65 to 70 with a history of 3 consecutive normal Pap tests.  Hepatitis C blood test.** / For any individual with known risks for hepatitis C.  Skin self-exam. / Monthly.  Influenza immunization.** / Every year.  Pneumococcal polysaccharide immunization.** / 1 to 2 doses if you smoke cigarettes or if you have certain chronic medical conditions.  Tetanus, diphtheria, pertussis (Tdap, Td) immunization. / A one-time dose of Tdap vaccine. After that, you need a Td booster dose every 10 years.  HPV immunization. / 3 doses over 6 months, if you are 26 and younger.  Measles, mumps, rubella (MMR) immunization. / You need at least 1 dose of MMR if you were born in 1957 or later. You may also need a second dose.  Meningococcal immunization. / 1 dose if you are age 19 to 21 and a first-year college student living in a residence hall, or have one of several medical conditions, you need to get vaccinated against meningococcal disease. You may also need additional booster doses.  Varicella immunization.** / Consult your caregiver.  Hepatitis A immunization.** / Consult your caregiver. 2 doses, 6 to 18 months apart.  Hepatitis B immunization.** / Consult your caregiver. 3 doses usually over 6 months. Ages 40 to 64  Blood pressure check.** / Every 1 to 2 years.  Lipid and cholesterol check.** / Every 5 years beginning at age 20.  Clinical breast exam.** / Every year after age 40.  Mammogram.** / Every year beginning at age 40   and continuing for as long as you are in good health. Consult with your  caregiver.  Pap test.** / Every 3 years starting at age 30 through age 65 or 70 with a history of 3 consecutive normal Pap tests.  HPV screening.** / Every 3 years from ages 30 through ages 65 to 70 with a history of 3 consecutive normal Pap tests.  Fecal occult blood test (FOBT) of stool. / Every year beginning at age 50 and continuing until age 75. You may not need to do this test if you get a colonoscopy every 10 years.  Flexible sigmoidoscopy or colonoscopy.** / Every 5 years for a flexible sigmoidoscopy or every 10 years for a colonoscopy beginning at age 50 and continuing until age 75.  Hepatitis C blood test.** / For all people born from 1945 through 1965 and any individual with known risks for hepatitis C.  Skin self-exam. / Monthly.  Influenza immunization.** / Every year.  Pneumococcal polysaccharide immunization.** / 1 to 2 doses if you smoke cigarettes or if you have certain chronic medical conditions.  Tetanus, diphtheria, pertussis (Tdap, Td) immunization.** / A one-time dose of Tdap vaccine. After that, you need a Td booster dose every 10 years.  Measles, mumps, rubella (MMR) immunization. / You need at least 1 dose of MMR if you were born in 1957 or later. You may also need a second dose.  Varicella immunization.** / Consult your caregiver.  Meningococcal immunization.** / Consult your caregiver.  Hepatitis A immunization.** / Consult your caregiver. 2 doses, 6 to 18 months apart.  Hepatitis B immunization.** / Consult your caregiver. 3 doses, usually over 6 months. Ages 65 and over  Blood pressure check.** / Every 1 to 2 years.  Lipid and cholesterol check.** / Every 5 years beginning at age 20.  Clinical breast exam.** / Every year after age 40.  Mammogram.** / Every year beginning at age 40 and continuing for as long as you are in good health. Consult with your caregiver.  Pap test.** / Every 3 years starting at age 30 through age 65 or 70 with a 3  consecutive normal Pap tests. Testing can be stopped between 65 and 70 with 3 consecutive normal Pap tests and no abnormal Pap or HPV tests in the past 10 years.  HPV screening.** / Every 3 years from ages 30 through ages 65 or 70 with a history of 3 consecutive normal Pap tests. Testing can be stopped between 65 and 70 with 3 consecutive normal Pap tests and no abnormal Pap or HPV tests in the past 10 years.  Fecal occult blood test (FOBT) of stool. / Every year beginning at age 50 and continuing until age 75. You may not need to do this test if you get a colonoscopy every 10 years.  Flexible sigmoidoscopy or colonoscopy.** / Every 5 years for a flexible sigmoidoscopy or every 10 years for a colonoscopy beginning at age 50 and continuing until age 75.  Hepatitis C blood test.** / For all people born from 1945 through 1965 and any individual with known risks for hepatitis C.  Osteoporosis screening.** / A one-time screening for women ages 65 and over and women at risk for fractures or osteoporosis.  Skin self-exam. / Monthly.  Influenza immunization.** / Every year.  Pneumococcal polysaccharide immunization.** / 1 dose at age 65 (or older) if you have never been vaccinated.  Tetanus, diphtheria, pertussis (Tdap, Td) immunization. / A one-time dose of Tdap vaccine if you are over   65 and have contact with an infant, are a healthcare worker, or simply want to be protected from whooping cough. After that, you need a Td booster dose every 10 years.  Varicella immunization.** / Consult your caregiver.  Meningococcal immunization.** / Consult your caregiver.  Hepatitis A immunization.** / Consult your caregiver. 2 doses, 6 to 18 months apart.  Hepatitis B immunization.** / Check with your caregiver. 3 doses, usually over 6 months. ** Family history and personal history of risk and conditions may change your caregiver's recommendations. Document Released: 07/20/2001 Document Revised: 08/16/2011  Document Reviewed: 10/19/2010 [employer] Patient Information 2013 [employer].  

## 2012-04-26 NOTE — Progress Notes (Signed)
Subjective:     Stacey [name] is a 51 y.o. female and is here for a comprehensive physical exam. The patient reports L shoulder pain.  She was told it was a rotator cuff problem.  She thinks its from lifting heavy things and caring for horse.   She feels that arm is weak.  History   Social History  . Marital Status: Married    Spouse Name: N/A    Number of Children: 2  . Years of Education: N/A   Occupational History  .     Social History Main Topics  . Smoking status: Never Smoker   . Smokeless tobacco: Never Used  . Alcohol Use: No  . Drug Use: No  . Sexually Active: Yes -- Female partner(s)    Birth Control/ Protection: Condom   Other Topics Concern  . Not on file   Social History Narrative  . No narrative on file   Health Maintenance  Topic Date Due  . Influenza Vaccine  02/06/2012  . Pap Smear  09/09/2012  . Mammogram  02/28/2013  . 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 She  does not have any pertinent problems on file. She  has past surgical history that includes Cesarean section and Popliteal synovial cyst excision. Her family history includes Anuerysm in her maternal grandfather; Cancer (age of onset:70) in her father; Celiac disease in her daughter; Diabetes in her daughters; and Prostate cancer in an unspecified family member. She  reports that she has never smoked. She has never used smokeless tobacco. She reports that she does not drink alcohol or use illicit drugs. She has a current medication list which includes the following prescription(s): cetirizine-pseudoephedrine, vitamin d, cyanocobalamin, fexofenadine, levothyroxine, multivitamin, and simvastatin. Current Outpatient Prescriptions on File Prior to Visit  Medication Sig Dispense Refill  . cetirizine-pseudoephedrine (ZYRTEC-D)  5-120 MG per tablet Take 1 tablet by mouth daily.        . Cholecalciferol (VITAMIN D) 1000 UNITS capsule Take 1,000 Units by mouth daily.        . cyanocobalamin (,VITAMIN B-12,) 1000 MCG/ML injection INJECT 1 ML SUBCUTANEOUSLY EVERY WEEK FOR ONE MONTH  10 mL  4  . fexofenadine (ALLEGRA) 180 MG tablet Take 180 mg by mouth daily.      Marland Kitchen levothyroxine (SYNTHROID, LEVOTHROID) 50 MCG tablet TAKE ONE TABLET BY MOUTH EVERY DAY REPEAT  LABS  ARE  DUE  NOW  30 tablet  0  . Multiple Vitamin (MULTIVITAMIN) tablet Take 1 tablet by mouth daily.        . simvastatin (ZOCOR) 40 MG tablet TAKE ONE TABLET BY MOUTH AT BEDTIME  30 tablet  5   She is allergic to other..  Review of Systems Review of Systems  Constitutional: Negative for activity change, appetite change and fatigue.  HENT: Negative for hearing loss, congestion, tinnitus and ear discharge.  dentist q33m Eyes: Negative for visual disturbance (see optho q1y -- vision corrected to 20/20 with glasses).  Respiratory: Negative for cough, chest tightness and shortness of breath.   Cardiovascular: Negative for chest pain, palpitations and leg swelling.  Gastrointestinal: Negative for abdominal pain, diarrhea, constipation and abdominal distention.  Genitourinary: Negative for urgency, frequency, decreased urine volume and difficulty urinating.  Musculoskeletal: Negative for back pain, arthralgias and gait problem.  Skin: Negative for color change, pallor  and rash.  Neurological: Negative for dizziness, light-headedness, numbness and headaches.  Hematological: Negative for adenopathy. Does not bruise/bleed easily.  Psychiatric/Behavioral: Negative for suicidal ideas, confusion, sleep disturbance, self-injury, dysphoric mood, decreased concentration and agitation.       Objective:    BP 102/60  Pulse 80  Temp 97.8 F (36.6 C) (Oral)  Ht 5' 2.5" (1.588 m)  Wt 121 lb 9.6 oz (55.157 kg)  BMI 21.89 kg/m2  SpO2 97% General appearance: alert,  cooperative, appears stated age and no distress Head: Normocephalic, without obvious abnormality, atraumatic Eyes: conjunctivae/corneas clear. PERRL, EOM's intact. Fundi benign. Ears: normal TM's and external ear canals both ears Nose: Nares normal. Septum midline. Mucosa normal. No drainage or sinus tenderness. Throat: lips, mucosa, and tongue normal; teeth and gums normal Neck: no adenopathy, no carotid bruit, no JVD, supple, symmetrical, trachea midline and thyroid not enlarged, symmetric, no tenderness/mass/nodules Back: symmetric, no curvature. ROM normal. No CVA tenderness. Lungs: clear to auscultation bilaterally Breasts: gyn Heart: regular rate and rhythm, S1, S2 normal, no murmur, click, rub or gallop Abdomen: soft, non-tender; bowel sounds normal; no masses,  no organomegaly Pelvic: deferred--gyn Extremities: extremities normal, atraumatic, no cyanosis or edema Pulses: 2+ and symmetric Skin: Skin color, texture, turgor normal. No rashes or lesions Lymph nodes: Cervical, supraclavicular, and axillary nodes normal. Neurologic: Alert and oriented X 3, normal strength and tone. Normal symmetric reflexes. Normal coordination and gait psych--- no anxiety , no depression    Assessment:    Healthy female exam.      Plan:    ghm utd Check labs See After Visit Summary for Counseling Recommendations

## 2012-04-27 NOTE — Assessment & Plan Note (Signed)
Check labs con't meds 

## 2012-05-02 MED ORDER — LEVOTHYROXINE SODIUM 75 MCG PO TABS: 75.0000 ug | ORAL_TABLET | Freq: Every day | ORAL | Status: DC

## 2012-06-30 ENCOUNTER — Encounter: Payer: Self-pay | Admitting: Family Medicine

## 2012-06-30 ENCOUNTER — Ambulatory Visit (INDEPENDENT_AMBULATORY_CARE_PROVIDER_SITE_OTHER): Payer: BC Managed Care – PPO | Admitting: Family Medicine

## 2012-06-30 VITALS — BP 100/60 | HR 77 | Temp 98.4°F | Wt 124.4 lb

## 2012-06-30 DIAGNOSIS — R42 Dizziness and giddiness: Secondary | ICD-10-CM

## 2012-06-30 MED ORDER — MOMETASONE FUROATE 50 MCG/ACT NA SUSP: 2.0000 | Freq: Every day | NASAL | Status: DC

## 2012-06-30 NOTE — Progress Notes (Signed)
  Subjective:     Stacey [name] is a 52 y.o. female who presents for evaluation of bilateral ear pain. Symptoms have been present for 2 days. She also notes no hearing loss, no drainage and mild pain in both ears. She does not have a history of ear infections. She does have a history of recent swimming.  The patient's history has been marked as reviewed and updated as appropriate.   Review of Systems Pertinent items are noted in HPI.   Objective:    BP 100/60  Pulse 77  Temp 98.4 F (36.9 C) (Oral)  Wt 124 lb 6.4 oz (56.427 kg)  SpO2 96% General:  alert, cooperative, appears stated age and no distress  Right Ear: both canals normal  Left Ear: both TM's dull and L TM + fluid  Mouth:  lips, mucosa, and tongue normal; teeth and gums normal  Neck: no adenopathy, supple, symmetrical, trachea midline and thyroid not enlarged, symmetric, no tenderness/mass/nodules       Assessment:       otalgia---  ? Viral    Plan:    Treatment: cont zyrtec and use nasonex. OTC analgesia as needed. Water exclusion from affected ear until symptoms resolve. Follow up in a few weeks if symptoms not improving. ---we will refer to ENT if no better

## 2012-06-30 NOTE — Patient Instructions (Signed)

## 2012-08-06 ENCOUNTER — Other Ambulatory Visit: Payer: Self-pay | Admitting: Family Medicine

## 2012-08-10 ENCOUNTER — Other Ambulatory Visit: Payer: Self-pay | Admitting: Family Medicine

## 2012-08-17 ENCOUNTER — Telehealth: Payer: Self-pay | Admitting: Family Medicine

## 2012-08-17 NOTE — Telephone Encounter (Signed)
Only if pt agrees

## 2012-08-17 NOTE — Telephone Encounter (Signed)
Refill: Levothyroxine 75 mcg tablet. 1 tab by mouth daily - repeat labs are due now. Qty 30. Received on 08-07-12  Fax from [facility] pharmacy states: This is a narrow therapeutic index medicine. We must get authorization from Dr. To switch to different generic. May we switch patient from [employer] to [facility] generic?

## 2012-08-17 NOTE — Telephone Encounter (Signed)
Please advise      KP 

## 2012-08-18 NOTE — Telephone Encounter (Signed)
Spoke with pharmacist and she voiced understanding. They will make the patient aware.     KP

## 2012-09-15 ENCOUNTER — Ambulatory Visit (INDEPENDENT_AMBULATORY_CARE_PROVIDER_SITE_OTHER): Payer: BC Managed Care – PPO | Admitting: Women's Health

## 2012-09-15 ENCOUNTER — Encounter: Payer: Self-pay | Admitting: Women's Health

## 2012-09-15 VITALS — BP 112/62 | Ht 62.0 in | Wt 124.0 lb

## 2012-09-15 DIAGNOSIS — N912 Amenorrhea, unspecified: Secondary | ICD-10-CM

## 2012-09-15 DIAGNOSIS — Z01419 Encounter for gynecological examination (general) (routine) without abnormal findings: Secondary | ICD-10-CM

## 2012-09-15 LAB — FOLLICLE STIMULATING HORMONE: FSH: 114.3 m[IU]/mL

## 2012-09-15 NOTE — Progress Notes (Signed)
Stacey [name] [birth year] [id number]    History:    The patient presents for annual exam.  Amenorrheic greater than one year, using condoms. Hypothyroid managed by primary care. History of normal mammograms and Paps.   Past medical history, past surgical history, family history and social history were all reviewed and documented in the EPIC chart. Working in  [employer] in [employer]. Stephanie type I diabetic, celiac disease and autoimmune disease CI M.D./ neuromuscular/eye problem. 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   ROS:  A  ROS was performed and pertinent positives and negatives are included in the history.  Exam:  Filed Vitals:   09/15/12 1039  BP: 112/62    General appearance:  Normal Head/Neck:  Normal, without cervical or supraclavicular adenopathy. Thyroid:  Symmetrical, normal in size, without palpable masses or nodularity. Respiratory  Effort:  Normal  Auscultation:  Clear without wheezing or rhonchi Cardiovascular  Auscultation:  Regular rate, without rubs, murmurs or gallops  Edema/varicosities:  Not grossly evident Abdominal  Soft,nontender, without masses, guarding or rebound.  Liver/spleen:  No organomegaly noted  Hernia:  None appreciated  Skin  Inspection:  Grossly normal  Palpation:  Grossly normal Neurologic/psychiatric  Orientation:  Normal with appropriate conversation.  Mood/affect:  Normal  Genitourinary    Breasts: Examined lying and sitting.     Right: Without masses, retractions, discharge or axillary adenopathy.     Left: Without masses, retractions, discharge or axillary adenopathy.   Inguinal/mons:  Normal without inguinal adenopathy  External genitalia:  Normal  BUS/Urethra/Skene's glands:  Normal  Bladder:  Normal  Vagina:  Normal  Cervix:  Normal  Uterus:   normal in size, shape and contour.  Midline and mobile  Adnexa/parametria:     Rt: Without  masses or tenderness.   Lt: Without masses or tenderness.  Anus and perineum: Normal  Digital rectal exam: Normal sphincter tone without palpated masses or tenderness  Assessment/Plan:  52 y.o. MWF G2P2 for annual exam with no menopausal complaints.  Normal GYN exam Hypothyroid-primary care labs and meds  Plan: [facility], reviewed most likely menopausal since amenorrheic x1 year. SBE's, continue annual mammogram, calcium rich diet, vitamin D 2000 daily encouraged. Continue active lifestyle, regular exercise. Schedule colonoscopy, reviewed importance of screen. Pap normal 2013, new screening guidelines reviewed.  Harrington Challenger WHNP, 1:27 PM 09/15/2012

## 2012-09-15 NOTE — Patient Instructions (Addendum)
Vit d 2000 daily Colonoscopy  Both you and Renae Fickle!!!!  Health Recommendations for Postmenopausal Women Based on the Results of the Women's Health Initiative [facility]) and Other Studies The WHI is a major 15-year research program to address the most common causes of death, disability and poor quality of life in postmenopausal women. Some of these causes are heart disease, cancer, bone loss (osteoporosis) and others. Taking into account all of the findings from [facility] and other studies, here are bottom-line health recommendations for women: CARDIOVASCULAR DISEASE Heart Disease: A heart attack is a medical emergency. Know the signs and symptoms of a heart attack. Hormone therapy should not be used to prevent heart disease. In women with heart disease, hormone therapy should not be used to prevent further disease. Hormone therapy increases the risk of blood clots. Below are things women can do to reduce their risk for heart disease.   Do not smoke. If you smoke, quit. Women who smoke are 2 to 6 times more likely to suffer a heart attack than non-smoking women.  Aim for a healthy weight. Being overweight causes many preventable deaths. Eat a healthy and balanced diet and drink an adequate amount of liquids.  Get moving. Make a commitment to be more physically active. Aim for 30 minutes of activity on most, if not all days of the week.  Eat for heart health. Choose a diet that is low in saturated fat, trans fat, and cholesterol. Include whole grains, vegetables, and fruits. Read the labels on the food container before buying it.  Know your numbers. Ask your caregiver to check your blood pressure, cholesterol (total, HDL, LDL, triglycerides) and blood glucose. Work with your caregiver to improve any numbers that are not normal.  High blood pressure. Limit or stop your table salt intake (try salt substitute and food seasonings), avoid salty foods and drinks. Read the labels on the food container before buying it.  Avoid becoming overweight by eating well and exercising. STROKE  Stroke is a medical emergency. Stroke can be the result of a blood clot in the blood vessel in the brain or by a brain hemorrhage (bleeding). Know the signs and symptoms of a stroke. To lower the risk of developing a stroke:  Avoid fatty foods.  Quit smoking.  Control your diabetes, blood pressure, and irregular heart rate. THROMBOPHLIBITIS (BLOOD CLOT) OF THE LEG  Hormone treatment is a big cause of developing blood clots in the leg. Becoming overweight and leading a stationary lifestyle also may contribute to developing blood clots. Controlling your diet and exercising will help lower the risk of developing blood clots. CANCER SCREENING  Breast Cancer: Women should take steps to reduce their risk of breast cancer. This includes having regular mammograms, monthly self breast exams and regular breast exams by your caregiver. Have a mammogram every one to two years if you are 46 to 50 years old. Have a mammogram annually if you are 16 years old or older depending on your risk factors. Women who are high risk for breast cancer may need more frequent mammograms. There are tests available (testing the genes in your body) if you have family history of breast cancer called BRCA 1 and 2. These tests can help determine the risks of developing breast cancer.  Intestinal or Stomach Cancer: Women should talk to their caregiver about when to start screening, what tests and how often they should be done, and the benefits and risks of doing these tests. Tests to consider are a rectal exam,  fecal occult blood, sigmoidoscopy, colononoscoby, barium enema and upper GI series of the stomach. Depending on the age, you may want to get a medical and family history of colon cancer. Women who are high risk may need to be screened at an earlier age and more often.  Cervical Cancer: A Pap test of the cervix should be done every year and every 3 years when there  has been three straight years of a normal Pap test. Women with an abnormal Pap test should be screened more often or have a cervical biopsy depending on your caregiver's recommendation.  Uterine Cancer: If you have vaginal bleeding after you are in the menopause, it should be evaluated by your caregiver.  Ovarian cancer: There are no reliable tests available to screen for ovarian cancer at this time except for yearly pelvic exams.  Lung Cancer: Yearly chest X-rays can detect lung cancer and should be done on high risk women, such as cigarette smokers and women with chronic lung disease (emphysemia).  Skin Cancer: A complete body skin exam should be done at your yearly examination. Avoid overexposure to the sun and ultraviolet light lamps. Use a strong sun block cream when in the sun. All of these things are important in lowering the risk of skin cancer. MENOPAUSE Menopause Symptoms: Hormone therapy products are effective for treating symptoms associated with menopause:  Moderate to severe hot flashes.  Night sweats.  Mood swings.  Headaches.  Tiredness.  Loss of sex drive.  Insomnia.  Other symptoms. However, hormone therapy products carry serious risks, especially in older women. Women who use or are thinking about using estrogen or estrogen with progestin treatments should discuss that with their caregiver. Your caregiver will know if the benefits outweigh the risks. The Food and Drug Administration (FDA) has concluded that hormone therapy should be used only at the lowest doses and for the shortest amount of time to reach treatment goals. It is not known at what doses there may be less risk of serious side effects. There are other treatments such as herbal medication (not controlled or regulated by the FDA), group therapy, counseling and acupuncture that may be helpful. OSTEOPOROSIS Protecting Against Bone Loss and Preventing Fracture: If hormone therapy is used for prevention of bone  loss (osteoporosis), the risks for bone loss must outweigh the risk of the therapy. Women considering taking hormone therapy for bone loss should ask their health care providers about other medications (fosamax and boniva) that are considered safe and effective for preventing bone loss and bone fractures. To guard against bone loss or fractures, it is recommended that women should take at least 1000-1500 mg of calcium and 400-800 IU of vitamin D daily in divided doses. Smoking and excessive alcohol intake increases the risk of osteoporosis. Eat foods rich in calcium and vitamin D and do weight bearing exercises several times a week as your caregiver suggests. DIABETES Diabetes Melitus: Women with Type I or Type 2 diabetes should keep their diabetes in control with diet, exercise and medication. Avoid too many sweets, starchy and fatty foods. Being overweight can affect your diabetes. COGNITION AND MEMORY Cognition and Memory: Menopausal hormone therapy is not recommended for the prevention of cognitive disorders such as Alzheimer's disease or memory loss. WHI found that women treated with hormone therapy have a greater risk of developing dementia.  DEPRESSION  Depression may occur at any age, but is common in elderly women. The reasons may be because of physical, medical, social (loneliness), financial and/or economic  problems and needs. Becoming involved with church, volunteer or social groups, seeking treatment for any physical or medical problems is recommended. Also, look into getting professional advice for any economic or financial problems. ACCIDENTS  Accidents are common and can be serious in the elderly woman. Prepare your house to prevent accidents. Eliminate throw rugs, use hip protectors, place hand bars in the bath, shower and toilet areas. Avoid wearing high heel shoes and walking on wet, snowy and icy areas. Stop driving if you have vision, hearing problems or are unsteady with you movements  and reflexes. RHEUMATOID ARTHRITIS Rheumatoid arthritis causes pain, swelling and stiffness of your bone joints. It can limit many of your activities. Over-the-counter medications may help, but prescription medications may be necessary. Talk with your caregiver about this. Exercise (walking, water aerobics), good posture, using splints on painful joints, warm baths or applying warm compresses to stiff joints and cold compresses to painful joints may be helpful. Smoking and excessive drinking may worsen the symptoms of arthritis. Seek help from a physical therapist if the arthritis is becoming a problem with your daily activities. IMMUNIZATIONS  Several immunizations are important to have during your senior years, including:   Tetanus and a diptheria shot booster every 10 years.  Influenza every year before the flu season begins.  Pneumonia vaccine.  Shingles vaccine.  Others as indicated (example: H1N1 vaccine). Document Released: 07/16/2005 Document Revised: 08/16/2011 Document Reviewed: 03/11/2008 [facility] Patient Information 2013 [place], [place].

## 2012-09-21 ENCOUNTER — Encounter: Payer: Self-pay | Admitting: Gynecology

## 2012-10-02 ENCOUNTER — Other Ambulatory Visit: Payer: Self-pay | Admitting: Family Medicine

## 2012-10-19 ENCOUNTER — Other Ambulatory Visit: Payer: Self-pay | Admitting: Family Medicine

## 2012-10-19 DIAGNOSIS — E039 Hypothyroidism, unspecified: Secondary | ICD-10-CM

## 2012-10-19 NOTE — Telephone Encounter (Signed)
Medication adjusted in November to 75 mcg and patient was supposed to follow up to repeat labs in 2 mos. msg left to call the office      KP

## 2012-10-19 NOTE — Telephone Encounter (Signed)
Apt scheduled for tomorrow and Rx faxed     KP

## 2012-10-20 ENCOUNTER — Other Ambulatory Visit (INDEPENDENT_AMBULATORY_CARE_PROVIDER_SITE_OTHER): Payer: BC Managed Care – PPO

## 2012-10-20 DIAGNOSIS — E039 Hypothyroidism, unspecified: Secondary | ICD-10-CM

## 2012-10-20 LAB — TSH: TSH: 3.27 u[IU]/mL (ref 0.35–5.50)

## 2012-10-23 ENCOUNTER — Encounter: Payer: Self-pay | Admitting: *Deleted

## 2012-11-16 ENCOUNTER — Other Ambulatory Visit: Payer: Self-pay | Admitting: General Practice

## 2012-11-16 DIAGNOSIS — E039 Hypothyroidism, unspecified: Secondary | ICD-10-CM

## 2012-11-16 MED ORDER — LEVOTHYROXINE SODIUM 75 MCG PO TABS: ORAL_TABLET | ORAL | Status: DC

## 2012-11-16 NOTE — Telephone Encounter (Signed)
Med filled.  

## 2012-12-11 ENCOUNTER — Other Ambulatory Visit: Payer: Self-pay | Admitting: Family Medicine

## 2013-02-28 ENCOUNTER — Other Ambulatory Visit: Payer: Self-pay

## 2013-02-28 MED ORDER — SIMVASTATIN 40 MG PO TABS: ORAL_TABLET | ORAL | Status: DC

## 2013-03-27 ENCOUNTER — Other Ambulatory Visit: Payer: Self-pay | Admitting: General Practice

## 2013-03-27 DIAGNOSIS — E039 Hypothyroidism, unspecified: Secondary | ICD-10-CM

## 2013-03-27 MED ORDER — LEVOTHYROXINE SODIUM 75 MCG PO TABS: ORAL_TABLET | ORAL | Status: DC

## 2013-03-27 NOTE — Telephone Encounter (Signed)
Med filled and pt sent a letter to make physical appt.

## 2013-04-18 ENCOUNTER — Other Ambulatory Visit: Payer: Self-pay | Admitting: Family Medicine

## 2013-04-18 NOTE — Telephone Encounter (Signed)
Levothyroxine refilled for one month. OV due

## 2013-05-04 ENCOUNTER — Other Ambulatory Visit: Payer: Self-pay | Admitting: Family Medicine

## 2013-05-23 ENCOUNTER — Other Ambulatory Visit: Payer: Self-pay | Admitting: Family Medicine

## 2013-07-25 ENCOUNTER — Other Ambulatory Visit: Payer: Self-pay | Admitting: Family Medicine

## 2013-07-29 ENCOUNTER — Other Ambulatory Visit: Payer: Self-pay | Admitting: Family Medicine

## 2013-08-31 ENCOUNTER — Other Ambulatory Visit: Payer: Self-pay | Admitting: Family Medicine

## 2013-09-01 ENCOUNTER — Encounter: Payer: Self-pay | Admitting: Family Medicine

## 2013-09-01 ENCOUNTER — Ambulatory Visit (INDEPENDENT_AMBULATORY_CARE_PROVIDER_SITE_OTHER): Payer: BC Managed Care – PPO | Admitting: Family Medicine

## 2013-09-01 VITALS — BP 110/68 | HR 71 | Temp 98.0°F | Resp 18 | Ht 62.0 in | Wt 131.1 lb

## 2013-09-01 DIAGNOSIS — J019 Acute sinusitis, unspecified: Secondary | ICD-10-CM

## 2013-09-01 MED ORDER — PREDNISONE 10 MG PO TABS: ORAL_TABLET | ORAL | Status: DC

## 2013-09-01 MED ORDER — CEFUROXIME AXETIL 500 MG PO TABS: 500.0000 mg | ORAL_TABLET | Freq: Two times a day (BID) | ORAL | Status: AC

## 2013-09-01 NOTE — Progress Notes (Signed)
Pre-visit discussion using our clinic review tool. No additional management support is needed unless otherwise documented below in the visit note.  

## 2013-09-01 NOTE — Patient Instructions (Addendum)
Use flonase or nasacort and otc antihistamine   Sinusitis Sinusitis is redness, soreness, and swelling (inflammation) of the paranasal sinuses. Paranasal sinuses are air pockets within the bones of your face (beneath the eyes, the middle of the forehead, or above the eyes). In healthy paranasal sinuses, mucus is able to drain out, and air is able to circulate through them by way of your nose. However, when your paranasal sinuses are inflamed, mucus and air can become trapped. This can allow bacteria and other germs to grow and cause infection. Sinusitis can develop quickly and last only a short time (acute) or continue over a long period (chronic). Sinusitis that lasts for more than 12 weeks is considered chronic.  CAUSES  Causes of sinusitis include:  Allergies.  Structural abnormalities, such as displacement of the cartilage that separates your nostrils (deviated septum), which can decrease the air flow through your nose and sinuses and affect sinus drainage.  Functional abnormalities, such as when the small hairs (cilia) that line your sinuses and help remove mucus do not work properly or are not present. SYMPTOMS  Symptoms of acute and chronic sinusitis are the same. The primary symptoms are pain and pressure around the affected sinuses. Other symptoms include:  Upper toothache.  Earache.  Headache.  Bad breath.  Decreased sense of smell and taste.  A cough, which worsens when you are lying flat.  Fatigue.  Fever.  Thick drainage from your nose, which often is green and may contain pus (purulent).  Swelling and warmth over the affected sinuses. DIAGNOSIS  Your caregiver will perform a physical exam. During the exam, your caregiver may:  Look in your nose for signs of abnormal growths in your nostrils (nasal polyps).  Tap over the affected sinus to check for signs of infection.  View the inside of your sinuses (endoscopy) with a special imaging device with a light  attached (endoscope), which is inserted into your sinuses. If your caregiver suspects that you have chronic sinusitis, one or more of the following tests may be recommended:  Allergy tests.  Nasal culture A sample of mucus is taken from your nose and sent to a lab and screened for bacteria.  Nasal cytology A sample of mucus is taken from your nose and examined by your caregiver to determine if your sinusitis is related to an allergy. TREATMENT  Most cases of acute sinusitis are related to a viral infection and will resolve on their own within 10 days. Sometimes medicines are prescribed to help relieve symptoms (pain medicine, decongestants, nasal steroid sprays, or saline sprays).  However, for sinusitis related to a bacterial infection, your caregiver will prescribe antibiotic medicines. These are medicines that will help kill the bacteria causing the infection.  Rarely, sinusitis is caused by a fungal infection. In theses cases, your caregiver will prescribe antifungal medicine. For some cases of chronic sinusitis, surgery is needed. Generally, these are cases in which sinusitis recurs more than 3 times per year, despite other treatments. HOME CARE INSTRUCTIONS   Drink plenty of water. Water helps thin the mucus so your sinuses can drain more easily.  Use a humidifier.  Inhale steam 3 to 4 times a day (for example, sit in the bathroom with the shower running).  Apply a warm, moist washcloth to your face 3 to 4 times a day, or as directed by your caregiver.  Use saline nasal sprays to help moisten and clean your sinuses.  Take over-the-counter or prescription medicines for pain, discomfort, or fever  only as directed by your caregiver. SEEK IMMEDIATE MEDICAL CARE IF:  You have increasing pain or severe headaches.  You have nausea, vomiting, or drowsiness.  You have swelling around your face.  You have vision problems.  You have a stiff neck.  You have difficulty breathing. MAKE  SURE YOU:   Understand these instructions.  Will watch your condition.  Will get help right away if you are not doing well or get worse. Document Released: 05/24/2005 Document Revised: 08/16/2011 Document Reviewed: 06/08/2011 [facility] Patient Information 2014 [place], [place].

## 2013-09-01 NOTE — Progress Notes (Signed)
  Subjective:     Stacey [name] is a 53 y.o. female who presents for evaluation of symptoms of a URI. Symptoms include congestion, nasal congestion, non productive cough, post nasal drip, sneezing and sore throat. Onset of symptoms was 1 week ago, and has been gradually worsening since that time. Treatment to date: antihistamines and cough suppressants.  The following portions of the patient's history were reviewed and updated as appropriate: allergies, current medications, past family history, past medical history, past social history, past surgical history and problem list.  Review of Systems Pertinent items are noted in HPI.   Objective:    BP 110/68  Pulse 71  Temp(Src) 98 F (36.7 C) (Oral)  Resp 18  Ht 5\' 2"  (1.575 m)  Wt 131 lb 1.9 oz (59.476 kg)  BMI 23.98 kg/m2  SpO2 98%  LMP 07/02/2011 General appearance: alert, cooperative, appears stated age and no distress Ears: normal TM's and external ear canals both ears Nose: clear discharge, mild congestion, turbinates red, swollen, no sinus tenderness Throat: abnormal findings: mild oropharyngeal erythema and pnd Neck: mild anterior cervical adenopathy, supple, symmetrical, trachea midline and thyroid not enlarged, symmetric, no tenderness/mass/nodules Lungs: clear to auscultation bilaterally   Assessment:    allergic rhinitis   Plan:    Discussed diagnosis and treatment of URI. Suggested symptomatic OTC remedies. Nasal steroids per orders. Follow up as needed. pred taper

## 2013-10-22 ENCOUNTER — Ambulatory Visit (INDEPENDENT_AMBULATORY_CARE_PROVIDER_SITE_OTHER): Payer: BC Managed Care – PPO | Admitting: Family Medicine

## 2013-10-22 ENCOUNTER — Encounter: Payer: Self-pay | Admitting: Family Medicine

## 2013-10-22 ENCOUNTER — Encounter: Payer: Self-pay | Admitting: Gastroenterology

## 2013-10-22 VITALS — BP 100/70 | HR 83 | Temp 98.0°F | Ht 61.25 in | Wt 126.0 lb

## 2013-10-22 DIAGNOSIS — R0683 Snoring: Secondary | ICD-10-CM

## 2013-10-22 DIAGNOSIS — Z0000 Encounter for general adult medical examination without abnormal findings: Secondary | ICD-10-CM

## 2013-10-22 DIAGNOSIS — E538 Deficiency of other specified B group vitamins: Secondary | ICD-10-CM

## 2013-10-22 DIAGNOSIS — R0609 Other forms of dyspnea: Secondary | ICD-10-CM

## 2013-10-22 DIAGNOSIS — R0989 Other specified symptoms and signs involving the circulatory and respiratory systems: Secondary | ICD-10-CM

## 2013-10-22 DIAGNOSIS — Z8379 Family history of other diseases of the digestive system: Secondary | ICD-10-CM

## 2013-10-22 LAB — CBC WITH DIFFERENTIAL/PLATELET
Basophils Absolute: 0 10*3/uL (ref 0.0–0.1)
Basophils Relative: 0.8 % (ref 0.0–3.0)
Eosinophils Absolute: 0.1 10*3/uL (ref 0.0–0.7)
Eosinophils Relative: 1.7 % (ref 0.0–5.0)
HCT: 45.3 % (ref 36.0–46.0)
Hemoglobin: 15.1 g/dL — ABNORMAL HIGH (ref 12.0–15.0)
Lymphocytes Relative: 29.1 % (ref 12.0–46.0)
Lymphs Abs: 1.4 10*3/uL (ref 0.7–4.0)
MCHC: 33.3 g/dL (ref 30.0–36.0)
MCV: 94.2 fl (ref 78.0–100.0)
Monocytes Absolute: 0.3 10*3/uL (ref 0.1–1.0)
Monocytes Relative: 7.1 % (ref 3.0–12.0)
Neutro Abs: 2.9 10*3/uL (ref 1.4–7.7)
Neutrophils Relative %: 61.3 % (ref 43.0–77.0)
Platelets: 268 10*3/uL (ref 150.0–400.0)
RBC: 4.81 Mil/uL (ref 3.87–5.11)
RDW: 12.5 % (ref 11.5–15.5)
WBC: 4.8 10*3/uL (ref 4.0–10.5)

## 2013-10-22 LAB — BASIC METABOLIC PANEL
BUN: 16 mg/dL (ref 6–23)
CO2: 31 mEq/L (ref 19–32)
Calcium: 9.4 mg/dL (ref 8.4–10.5)
Chloride: 101 mEq/L (ref 96–112)
Creatinine, Ser: 0.7 mg/dL (ref 0.4–1.2)
GFR: 93.71 mL/min (ref >60.00)
Glucose, Bld: 85 mg/dL (ref 70–99)
Potassium: 3.6 mEq/L (ref 3.5–5.1)
Sodium: 138 mEq/L (ref 135–145)

## 2013-10-22 LAB — POCT URINALYSIS DIPSTICK
Bilirubin, UA: NEGATIVE
Blood, UA: NEGATIVE
Glucose, UA: NEGATIVE
Ketones, UA: NEGATIVE
Leukocytes, UA: NEGATIVE
Nitrite, UA: NEGATIVE
Protein, UA: NEGATIVE
Spec Grav, UA: 1.025
Urobilinogen, UA: 0.2
pH, UA: 5

## 2013-10-22 LAB — HEPATIC FUNCTION PANEL
ALT: 16 U/L (ref 0–35)
AST: 21 U/L (ref 0–37)
Albumin: 4.2 g/dL (ref 3.5–5.2)
Alkaline Phosphatase: 66 U/L (ref 39–117)
Bilirubin, Direct: 0 mg/dL (ref 0.0–0.3)
Total Bilirubin: 0.8 mg/dL (ref 0.2–1.2)
Total Protein: 7.2 g/dL (ref 6.0–8.3)

## 2013-10-22 LAB — LIPID PANEL
Cholesterol: 208 mg/dL — ABNORMAL HIGH (ref 0–200)
HDL: 67.3 mg/dL (ref >39.00)
LDL Cholesterol: 126 mg/dL — ABNORMAL HIGH (ref 0–99)
Total CHOL/HDL Ratio: 3
Triglycerides: 74 mg/dL (ref 0.0–149.0)
VLDL: 14.8 mg/dL (ref 0.0–40.0)

## 2013-10-22 LAB — TSH: TSH: 2.19 u[IU]/mL (ref 0.35–4.50)

## 2013-10-22 LAB — VITAMIN B12: Vitamin B-12: 945 pg/mL — ABNORMAL HIGH (ref 211–911)

## 2013-10-22 NOTE — Progress Notes (Signed)
Subjective:     Stacey [name] is a 53 y.o. female and is here for a comprehensive physical exam. The patient reports no problems.  History   Social History  . Marital Status: Married    Spouse Name: N/A    Number of Children: 2  . Years of Education: N/A   Occupational History  .     Social History Main Topics  . Smoking status: Never Smoker   . Smokeless tobacco: Never Used  . Alcohol Use: No  . Drug Use: No  . Sexual Activity: Yes    Partners: Male    Birth Control/ Protection: Condom   Other Topics Concern  . Not on file   Social History Narrative  . No narrative on file   Health Maintenance  Topic Date Due  . Colonoscopy  09/04/2012  . Pap Smear  09/09/2012  . Mammogram  12/22/2013 (Originally 02/28/2013)  . Influenza Vaccine  01/05/2014  . 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 She  does not have any pertinent problems on file. She  has past surgical history that includes Cesarean section and Popliteal synovial cyst excision. Her family history includes Anuerysm in her maternal grandfather; Cancer (age of onset: 49) in her father; Celiac disease in her daughter; Diabetes in her daughter and daughter; Prostate cancer in an other family member. She  reports that she has never smoked. She has never used smokeless tobacco. She reports that she does not drink alcohol or use illicit drugs. She has a current medication list which includes the following prescription(s): cetirizine, vitamin d, cyanocobalamin, levothyroxine, mometasone, multivitamin, and simvastatin. Current Outpatient Prescriptions on File Prior to Visit  Medication Sig Dispense Refill  . cetirizine (ZYRTEC) 10 MG tablet Take 10 mg by mouth daily.      . Cholecalciferol (VITAMIN D) 1000 UNITS capsule Take 1,000 Units by mouth  daily.        Marland Kitchen levothyroxine (SYNTHROID, LEVOTHROID) 75 MCG tablet Take 1 tablet (75 mcg total) by mouth daily before breakfast.  30 tablet  4  . mometasone (NASONEX) 50 MCG/ACT nasal spray Place 2 sprays into the nose daily.  17 g  12  . Multiple Vitamin (MULTIVITAMIN) tablet Take 1 tablet by mouth daily.        . simvastatin (ZOCOR) 40 MG tablet 1 tab by mouth daily--labs are due now  30 tablet  0   No current facility-administered medications on file prior to visit.   She is allergic to other..  Review of Systems Review of Systems  Constitutional: Negative for activity change, appetite change and fatigue.  HENT: Negative for hearing loss, congestion, tinnitus and ear discharge.  dentist q53m Eyes: Negative for visual disturbance (see optho q1y -- vision corrected to 20/20 with glasses).  Respiratory: Negative for cough, chest tightness and shortness of breath.   Cardiovascular: Negative for chest pain, palpitations and leg swelling.  Gastrointestinal: Negative for abdominal pain, diarrhea, constipation and abdominal distention.  Genitourinary: Negative for urgency, frequency, decreased urine volume and difficulty urinating.  Musculoskeletal: Negative for back pain, arthralgias and gait problem.  Skin: Negative for color change, pallor and rash.  Neurological: Negative for dizziness, light-headedness, numbness and headaches.  Hematological: Negative for adenopathy. Does not bruise/bleed easily.  Psychiatric/Behavioral: Negative for suicidal ideas, confusion, sleep disturbance, self-injury, dysphoric mood, decreased concentration and agitation.  Objective:    BP 100/70  Pulse 83  Temp(Src) 98 F (36.7 C) (Oral)  Ht 5' 1.25" (1.556 m)  Wt 126 lb (57.153 kg)  BMI 23.61 kg/m2  SpO2 98%  LMP 07/02/2011 General appearance: alert, cooperative, appears stated age and no distress Head: Normocephalic, without obvious abnormality, atraumatic Eyes: conjunctivae/corneas clear.  PERRL, EOM's intact. Fundi benign. Ears: normal TM's and external ear canals both ears Nose: Nares normal. Septum midline. Mucosa normal. No drainage or sinus tenderness. Throat: lips, mucosa, and tongue normal; teeth and gums normal Neck: no adenopathy, no carotid bruit, no JVD, supple, symmetrical, trachea midline and thyroid not enlarged, symmetric, no tenderness/mass/nodules Back: symmetric, no curvature. ROM normal. No CVA tenderness. Lungs: clear to auscultation bilaterally Breasts: gyn Heart: regular rate and rhythm, S1, S2 normal, no murmur, click, rub or gallop Abdomen: soft, non-tender; bowel sounds normal; no masses,  no organomegaly Pelvic: deferred--gyn Extremities: extremities normal, atraumatic, no cyanosis or edema Pulses: 2+ and symmetric Skin: Skin color, texture, turgor normal. No rashes or lesions Lymph nodes: Cervical, supraclavicular, and axillary nodes normal. Neurologic: Alert and oriented X 3, normal strength and tone. Normal symmetric reflexes. Normal coordination and gait   psych-- no depression, no anxiety Assessment:    Healthy female exam.       Plan:     check labs ghm utd See After Visit Summary for Counseling Recommendations   1. Preventative health care   - Basic metabolic panel - CBC with Differential - Hepatic function panel - Lipid panel - POCT urinalysis dipstick - TSH - Ambulatory referral to Gastroenterology  2. Snoring   - Ambulatory referral to Pulmonology  3. Family history of celiac disease Pt would like labs checked but will check with INS first  4. B12 deficiency  - Vitamin B12

## 2013-10-22 NOTE — Patient Instructions (Signed)

## 2013-10-22 NOTE — Progress Notes (Signed)
Pre visit review using our clinic review tool, if applicable. No additional management support is needed unless otherwise documented below in the visit note. 

## 2013-10-30 ENCOUNTER — Other Ambulatory Visit: Payer: Self-pay | Admitting: Family Medicine

## 2013-11-22 ENCOUNTER — Other Ambulatory Visit: Payer: Self-pay | Admitting: Family Medicine

## 2013-11-23 ENCOUNTER — Ambulatory Visit (INDEPENDENT_AMBULATORY_CARE_PROVIDER_SITE_OTHER): Payer: BC Managed Care – PPO | Admitting: Pulmonary Disease

## 2013-11-23 ENCOUNTER — Encounter: Payer: Self-pay | Admitting: Pulmonary Disease

## 2013-11-23 VITALS — BP 118/70 | HR 92 | Temp 97.7°F | Ht 61.5 in | Wt 127.8 lb

## 2013-11-23 DIAGNOSIS — R0683 Snoring: Secondary | ICD-10-CM | POA: Insufficient documentation

## 2013-11-23 DIAGNOSIS — R0609 Other forms of dyspnea: Secondary | ICD-10-CM

## 2013-11-23 DIAGNOSIS — R0989 Other specified symptoms and signs involving the circulatory and respiratory systems: Secondary | ICD-10-CM

## 2013-11-23 NOTE — Patient Instructions (Signed)
I think you are low risk for clinically significant sleep apnea.  However, we can do home sleep testing if it is going to offer re-assurance to everyone involved.  Let me know Try and stay off your back as much as possible Can consider a dental appliance for snoring

## 2013-11-23 NOTE — Assessment & Plan Note (Signed)
The patient's history and body habitus makes her a fairly low risk for significant sleep disordered breathing. I have offered to do home sleep testing if she or her husband are overly concerned and wish to put the issue to rest.  I have also discussed with her various ways to decrease her snoring, including positional therapy and possibly a dental appliance. The patient will discuss all of this with her husband and let me know if she wishes to have home sleep testing.

## 2013-11-23 NOTE — Progress Notes (Signed)
Subjective:    Patient ID: Stacey [name], female    DOB: 1961/03/26, 53 y.o.   MRN: [id number]  HPI The patient is a 53 year old female who I've been asked to see for possible obstructive sleep apnea. She has been noted by her husband to have snoring during the night, and occasionally an abnormal breathing pattern during sleep. The patient denies any choking or snoring arousals, but does awaken about one to 3 times a night. However, she feels very rested in the mornings upon arising. She denies any significant daytime sleepiness, but stays extremely active during the day. She also stays very busy at night, and does not have any down time. She has minimal sleep pressure with longer distance driving, and no issues short distances. Her weight is stable over the last 2 years, and her Epworth score today is 2   Sleep Questionnaire What time do you typically go to bed?( Between what hours) 11:30-12:00 11:30-12:00 at 1013 on 11/23/13 by Lilli Few, CMA How long does it take you to fall asleep? 15-25 minutes 15-25 minutes at 1013 on 11/23/13 by Lilli Few, CMA How many times during the night do you wake up? 3 3 at 1013 on 11/23/13 by Lilli Few, CMA What time do you get out of bed to start your day? 0630 0630 at 1013 on 11/23/13 by Lilli Few, CMA Do you drive or operate heavy machinery in your occupation? No No at 1013 on 11/23/13 by Lilli Few, CMA How much has your weight changed (up or down) over the past two years? (In pounds) 0 oz (0 kg) 0 oz (0 kg) at 1013 on 11/23/13 by Lilli Few, CMA Have you ever had a sleep study before? No No at 1013 on 11/23/13 by Lilli Few, CMA Do you currently use CPAP? No No at 1013 on 11/23/13 by Lilli Few, CMA Do you wear oxygen at any time? No    Review of Systems  Constitutional: Negative for fever and unexpected weight change.  HENT: Negative for congestion, dental problem,  ear pain, nosebleeds, postnasal drip, rhinorrhea, sinus pressure, sneezing, sore throat and trouble swallowing.   Eyes: Negative for redness and itching.  Respiratory: Negative for cough, chest tightness, shortness of breath and wheezing.   Cardiovascular: Negative for palpitations and leg swelling.  Gastrointestinal: Negative for nausea and vomiting.  Genitourinary: Negative for dysuria.  Musculoskeletal: Negative for joint swelling.  Skin: Negative for rash.  Neurological: Negative for headaches.  Hematological: Does not bruise/bleed easily.  Psychiatric/Behavioral: Negative for dysphoric mood. The patient is not nervous/anxious.        Objective:   Physical Exam Constitutional:  Well developed, no acute distress  HENT:  Nares patent without discharge  Oropharynx without exudate, palate and uvula are mildly elongated.  Eyes:  Perrla, eomi, no scleral icterus  Neck:  No JVD, no TMG  Cardiovascular:  Normal rate, regular rhythm, no rubs or gallops.  No murmurs        Intact distal pulses  Pulmonary :  Normal breath sounds, no stridor or respiratory distress   No rales, rhonchi, or wheezing  Abdominal:  Soft, nondistended, bowel sounds present.  No tenderness noted.   Musculoskeletal:  No lower extremity edema noted.  Lymph Nodes:  No cervical lymphadenopathy noted  Skin:  No cyanosis noted  Neurologic:  Alert, appropriate, moves all 4 extremities without obvious deficit.         Assessment & Plan:

## 2013-12-19 ENCOUNTER — Ambulatory Visit (AMBULATORY_SURGERY_CENTER): Payer: Self-pay | Admitting: *Deleted

## 2013-12-19 VITALS — Ht 61.5 in | Wt 131.0 lb

## 2013-12-19 DIAGNOSIS — Z1211 Encounter for screening for malignant neoplasm of colon: Secondary | ICD-10-CM

## 2013-12-19 MED ORDER — NA SULFATE-K SULFATE-MG SULF 17.5-3.13-1.6 GM/177ML PO SOLN: 1.0000 | Freq: Once | ORAL | Status: DC

## 2013-12-19 NOTE — Progress Notes (Signed)
No allergies to eggs or soy. No problems with anesthesia.  Pt given Emmi instructions for colonoscopy  No oxygen use  No diet drug use  

## 2013-12-20 ENCOUNTER — Encounter: Payer: Self-pay | Admitting: Gastroenterology

## 2014-01-02 ENCOUNTER — Ambulatory Visit (AMBULATORY_SURGERY_CENTER): Payer: BC Managed Care – PPO | Admitting: Gastroenterology

## 2014-01-02 ENCOUNTER — Encounter: Payer: Self-pay | Admitting: Gastroenterology

## 2014-01-02 VITALS — BP 99/76 | HR 74 | Temp 95.6°F | Resp 15 | Ht 61.0 in | Wt 131.0 lb

## 2014-01-02 DIAGNOSIS — Z1211 Encounter for screening for malignant neoplasm of colon: Secondary | ICD-10-CM

## 2014-01-02 MED ORDER — SODIUM CHLORIDE 0.9 % IV SOLN: 500.0000 mL | INTRAVENOUS | Status: DC

## 2014-01-02 NOTE — Progress Notes (Signed)
  [facility] Post-op Note  Patient: Stacey [name]  Procedure(s) Performed: colonoscopy  Patient Location: [facility] - Recovery Area  Anesthesia Type: Deep Sedation/Propofol  Level of Consciousness: awake, oriented and patient cooperative  Airway and Oxygen Therapy: Patient Spontanous Breathing  Post-op Pain: none  Post-op Assessment:  Post-op Vital signs reviewed, Patient's Cardiovascular Status Stable, Respiratory Function Stable, Patent Airway, No signs of Nausea or vomiting and Pain level controlled  Post-op Vital Signs: Reviewed and stable  Complications: No apparent anesthesia complications  Yahsir Wickens E 10:41 AM

## 2014-01-02 NOTE — Op Note (Signed)
[place]  [address] [place], [id number]   COLONOSCOPY PROCEDURE REPORT  PATIENT: Stacey [name], Stacey [name]  MR#: [id number] BIRTHDATE: 04-Feb-1961 , 53  yrs. old GENDER: Female ENDOSCOPIST: Inda Castle, MD REFERRED UL:AGTXMI [place], DO PROCEDURE DATE:  01/02/2014 PROCEDURE:   Colonoscopy, diagnostic First Screening Colonoscopy - Avg.  risk and is 50 yrs.  old or older Yes.  Prior Negative Screening - Now for repeat screening. N/A  History of Adenoma - Now for follow-up colonoscopy & has been > or = to 3 yrs.  N/A  Polyps Removed Today? No.  Recommend repeat exam, <10 yrs? No. ASA CLASS:   Class II INDICATIONS:average risk screening. MEDICATIONS: MAC sedation, administered by CRNA and propofol (Diprivan) 200mg  IV  DESCRIPTION OF PROCEDURE:   After the risks benefits and alternatives of the procedure were thoroughly explained, informed consent was obtained.  A digital rectal exam revealed no abnormalities of the rectum.   The LB [id number]  endoscope was introduced through the anus and advanced to the cecum, which was identified by both the appendix and ileocecal valve. No adverse events experienced.   The quality of the prep was excellent using Suprep  The instrument was then slowly withdrawn as the colon was fully examined.      COLON FINDINGS: A normal appearing cecum, ileocecal valve, and appendiceal orifice were identified.  The ascending, hepatic flexure, transverse, splenic flexure, descending, sigmoid colon and rectum appeared unremarkable.  No polyps or cancers were seen. Retroflexed views revealed no abnormalities. The time to cecum=4 minutes 57 seconds.  Withdrawal time=7 minutes 49 seconds.  The scope was withdrawn and the procedure completed. COMPLICATIONS: There were no complications.  ENDOSCOPIC IMPRESSION: Normal colon  RECOMMENDATIONS: Continue current colorectal screening recommendations for "routine risk" patients with a repeat  colonoscopy in 10 years.   eSigned:  Inda Castle, MD 01/02/2014 10:39 AM   cc:

## 2014-01-02 NOTE — Patient Instructions (Signed)
YOU HAD AN ENDOSCOPIC PROCEDURE TODAY AT THE [facility]: Refer to the procedure report that was given to you for any specific questions about what was found during the examination.  If the procedure report does not answer your questions, please call your gastroenterologist to clarify.  If you requested that your care partner not be given the details of your procedure findings, then the procedure report has been included in a sealed envelope for you to review at your convenience later.  YOU SHOULD EXPECT: Some feelings of bloating in the abdomen. Passage of more gas than usual.  Walking can help get rid of the air that was put into your GI tract during the procedure and reduce the bloating. If you had a lower endoscopy (such as a colonoscopy or flexible sigmoidoscopy) you may notice spotting of blood in your stool or on the toilet paper. If you underwent a bowel prep for your procedure, then you may not have a normal bowel movement for a few days.  DIET: Your first meal following the procedure should be a light meal and then it is ok to progress to your normal diet.  A half-sandwich or bowl of soup is an example of a good first meal.  Heavy or fried foods are harder to digest and may make you feel nauseous or bloated.  Likewise meals heavy in dairy and vegetables can cause extra gas to form and this can also increase the bloating.  Drink plenty of fluids but you should avoid alcoholic beverages for 24 hours.  ACTIVITY: Your care partner should take you home directly after the procedure.  You should plan to take it easy, moving slowly for the rest of the day.  You can resume normal activity the day after the procedure however you should NOT DRIVE or use heavy machinery for 24 hours (because of the sedation medicines used during the test).    SYMPTOMS TO REPORT IMMEDIATELY: A gastroenterologist can be reached at any hour.  During normal business hours, 8:30 AM to 5:00 PM Monday through Friday,  call [phone].  After hours and on weekends, please call the GI answering service at [phone] who will take a message and have the physician on call contact you.   Following lower endoscopy (colonoscopy or flexible sigmoidoscopy):  Excessive amounts of blood in the stool  Significant tenderness or worsening of abdominal pains  Swelling of the abdomen that is new, acute  Fever of 100F or higher   FOLLOW UP: If any biopsies were taken you will be contacted by phone or by letter within the next 1-3 weeks.  Call your gastroenterologist if you have not heard about the biopsies in 3 weeks.  Our staff will call the home number listed on your records the next business day following your procedure to check on you and address any questions or concerns that you may have at that time regarding the information given to you following your procedure. This is a courtesy call and so if there is no answer at the home number and we have not heard from you through the emergency physician on call, we will assume that you have returned to your regular daily activities without incident.  SIGNATURES/CONFIDENTIALITY: You and/or your care partner have signed paperwork which will be entered into your electronic medical record.  These signatures attest to the fact that that the information above on your After Visit Summary has been reviewed and is understood.  Full responsibility of the confidentiality of   of this discharge information lies with you and/or your care-partner.  Normal exam  Repeat colonoscopy in 10 years.

## 2014-01-03 ENCOUNTER — Telehealth: Payer: Self-pay | Admitting: *Deleted

## 2014-01-03 NOTE — Telephone Encounter (Signed)
No answer, message left for the patient. 

## 2014-03-06 ENCOUNTER — Encounter: Payer: Self-pay | Admitting: Family Medicine

## 2014-04-08 ENCOUNTER — Encounter: Payer: Self-pay | Admitting: Gastroenterology

## 2014-11-05 ENCOUNTER — Telehealth: Payer: Self-pay | Admitting: Family Medicine

## 2014-11-05 MED ORDER — LEVOTHYROXINE SODIUM 75 MCG PO TABS: ORAL_TABLET | ORAL | Status: DC

## 2014-11-05 NOTE — Telephone Encounter (Signed)
11/21/14 apt has been cancelled the patient has been made aware to keep the CPE. She is aware that the Rx has been faxed.      KP

## 2014-11-05 NOTE — Telephone Encounter (Signed)
Pt scheduled appt for 11/21/14 for med refills. CPE scheduled for 12/13/14. Pt asked if she can get 1 month supply of medication as she will be out of town for over a week with her daughter for testing in [place]. Please notify her when RX sent to pharmacy.  [facility] [address] in [place] ph# [phone]

## 2014-11-05 NOTE — Telephone Encounter (Signed)
Relation to pt: self  Call back [phone] Pharmacy: [facility] [address] ([address]), [place] [phone] (Phone) [phone] (Fax)         Reason for call:  Requesting a refill levothyroxine (SYNTHROID, LEVOTHROID) 75 MCG tablet. Pt is going out of town between the 5th thru 11th.

## 2014-11-05 NOTE — Telephone Encounter (Signed)
She needs an OV and labs. Please schedule.      KP

## 2014-11-21 ENCOUNTER — Ambulatory Visit: Payer: Self-pay | Admitting: Family Medicine

## 2014-11-21 ENCOUNTER — Telehealth: Payer: Self-pay | Admitting: Family Medicine

## 2014-11-21 NOTE — Telephone Encounter (Signed)
Pre visit letter sent  °

## 2014-12-12 ENCOUNTER — Telehealth: Payer: Self-pay | Admitting: *Deleted

## 2014-12-12 NOTE — Telephone Encounter (Signed)
Confirmed appointment with patient.

## 2014-12-13 ENCOUNTER — Ambulatory Visit (INDEPENDENT_AMBULATORY_CARE_PROVIDER_SITE_OTHER): Payer: BLUE CROSS/BLUE SHIELD | Admitting: Family Medicine

## 2014-12-13 ENCOUNTER — Encounter: Payer: Self-pay | Admitting: Family Medicine

## 2014-12-13 VITALS — BP 120/72 | HR 98 | Temp 97.6°F | Ht 61.0 in | Wt 129.4 lb

## 2014-12-13 DIAGNOSIS — E785 Hyperlipidemia, unspecified: Secondary | ICD-10-CM | POA: Diagnosis not present

## 2014-12-13 DIAGNOSIS — Z0000 Encounter for general adult medical examination without abnormal findings: Secondary | ICD-10-CM | POA: Diagnosis not present

## 2014-12-13 LAB — LIPID PANEL
Cholesterol: 230 mg/dL — ABNORMAL HIGH (ref 0–200)
HDL: 66.5 mg/dL (ref >39.00)
LDL Cholesterol: 146 mg/dL — ABNORMAL HIGH (ref 0–99)
NonHDL: 163.5
Total CHOL/HDL Ratio: 3
Triglycerides: 89 mg/dL (ref 0.0–149.0)
VLDL: 17.8 mg/dL (ref 0.0–40.0)

## 2014-12-13 LAB — BASIC METABOLIC PANEL
BUN: 17 mg/dL (ref 6–23)
CO2: 30 mEq/L (ref 19–32)
Calcium: 9.6 mg/dL (ref 8.4–10.5)
Chloride: 104 mEq/L (ref 96–112)
Creatinine, Ser: 0.82 mg/dL (ref 0.40–1.20)
GFR: 77.73 mL/min (ref >60.00)
Glucose, Bld: 90 mg/dL (ref 70–99)
Potassium: 3.9 mEq/L (ref 3.5–5.1)
Sodium: 139 mEq/L (ref 135–145)

## 2014-12-13 LAB — CBC WITH DIFFERENTIAL/PLATELET
Basophils Absolute: 0 10*3/uL (ref 0.0–0.1)
Basophils Relative: 0.8 % (ref 0.0–3.0)
Eosinophils Absolute: 0.1 10*3/uL (ref 0.0–0.7)
Eosinophils Relative: 1.7 % (ref 0.0–5.0)
HCT: 44.6 % (ref 36.0–46.0)
Hemoglobin: 14.8 g/dL (ref 12.0–15.0)
Lymphocytes Relative: 26.3 % (ref 12.0–46.0)
Lymphs Abs: 1.1 10*3/uL (ref 0.7–4.0)
MCHC: 33.3 g/dL (ref 30.0–36.0)
MCV: 93 fl (ref 78.0–100.0)
Monocytes Absolute: 0.3 10*3/uL (ref 0.1–1.0)
Monocytes Relative: 8.4 % (ref 3.0–12.0)
Neutro Abs: 2.5 10*3/uL (ref 1.4–7.7)
Neutrophils Relative %: 62.8 % (ref 43.0–77.0)
Platelets: 252 10*3/uL (ref 150.0–400.0)
RBC: 4.79 Mil/uL (ref 3.87–5.11)
RDW: 12.9 % (ref 11.5–15.5)
WBC: 4.1 10*3/uL (ref 4.0–10.5)

## 2014-12-13 LAB — HEPATIC FUNCTION PANEL
ALT: 16 U/L (ref 0–35)
AST: 23 U/L (ref 0–37)
Albumin: 4.2 g/dL (ref 3.5–5.2)
Alkaline Phosphatase: 73 U/L (ref 39–117)
Bilirubin, Direct: 0.1 mg/dL (ref 0.0–0.3)
Total Bilirubin: 0.6 mg/dL (ref 0.2–1.2)
Total Protein: 7.2 g/dL (ref 6.0–8.3)

## 2014-12-13 LAB — POCT URINALYSIS DIPSTICK
Bilirubin, UA: NEGATIVE
Blood, UA: NEGATIVE
Glucose, UA: NEGATIVE
Ketones, UA: NEGATIVE
Leukocytes, UA: NEGATIVE
Nitrite, UA: NEGATIVE
Protein, UA: NEGATIVE
Spec Grav, UA: >=1.03
Urobilinogen, UA: 4
pH, UA: 6

## 2014-12-13 LAB — MICROALBUMIN / CREATININE URINE RATIO
Creatinine,U: 158.5 mg/dL
Microalb Creat Ratio: 0.4 mg/g (ref 0.0–30.0)
Microalb, Ur: 0.7 mg/dL (ref 0.0–1.9)

## 2014-12-13 LAB — TSH: TSH: 2.26 u[IU]/mL (ref 0.35–4.50)

## 2014-12-13 NOTE — Progress Notes (Signed)
Pre visit review using our clinic review tool, if applicable. No additional management support is needed unless otherwise documented below in the visit note. 

## 2014-12-13 NOTE — Progress Notes (Signed)
Subjective:     Stacey [name] is a 54 y.o. female and is here for a comprehensive physical exam. The patient reports no problems.  History   Social History  . Marital Status: Married    Spouse Name: N/A  . Number of Children: 2  . Years of Education: N/A   Occupational History  . Training and development officer    Social History Main Topics  . Smoking status: Never Smoker   . Smokeless tobacco: Never Used  . Alcohol Use: No  . Drug Use: No  . Sexual Activity:    Partners: Male    Birth Control/ Protection: Condom   Other Topics Concern  . Not on file   Social History Narrative   Health Maintenance  Topic Date Due  . PAP SMEAR  12/13/2015 (Originally 01/24/2015)  . HIV Screening  12/13/2015 (Originally 09/04/1977)  . INFLUENZA VACCINE  01/06/2015  . MAMMOGRAM  02/20/2015  . TETANUS/TDAP  08/16/2021  . COLONOSCOPY  01/03/2024    The following portions of the patient's history were reviewed and updated as appropriate:  She  has a past medical history of Hyperlipidemia; Nephrolith (11/2005); Thyroid disease (2008); Pollen allergies; and Allergy. She  does not have any pertinent problems on file. She  has past surgical history that includes Cesarean section (1992) and Popliteal synovial cyst excision (Left, 1974). Her family history includes Anuerysm in her maternal grandfather; Cancer (age of onset: 28) in her father; Celiac disease in her daughter; Diabetes in her daughter and daughter; Prostate cancer in an other family member. There is no history of Colon cancer. She  reports that she has never smoked. She has never used smokeless tobacco. She reports that she does not drink alcohol or use illicit drugs. She has a current medication list which includes the following prescription(s): cetirizine, vitamin d, b-12, levothyroxine, and multivitamin. Current Outpatient Prescriptions on File Prior to Visit  Medication Sig Dispense Refill  . cetirizine (ZYRTEC) 10 MG tablet Take 10 mg by mouth  daily.    . Cholecalciferol (VITAMIN D) 1000 UNITS capsule Take 1,000 Units by mouth daily.      Marland Kitchen levothyroxine (SYNTHROID, LEVOTHROID) 75 MCG tablet TAKE ONE TABLET BY MOUTH ONCE DAILY BEFORE BREAKFAST 30 tablet 1  . Multiple Vitamin (MULTIVITAMIN) tablet Take 1 tablet by mouth daily.       No current facility-administered medications on file prior to visit.   She is allergic to other..  Review of Systems Review of Systems  Constitutional: Negative for activity change, appetite change and fatigue.  HENT: Negative for hearing loss, congestion, tinnitus and ear discharge.  dentist q95m Eyes: Negative for visual disturbance (see optho q1y -- vision corrected to 20/20 with glasses).  Respiratory: Negative for cough, chest tightness and shortness of breath.   Cardiovascular: Negative for chest pain, palpitations and leg swelling.  Gastrointestinal: Negative for abdominal pain, diarrhea, constipation and abdominal distention.  Genitourinary: Negative for urgency, frequency, decreased urine volume and difficulty urinating.  Musculoskeletal: Negative for back pain, arthralgias and gait problem.  Skin: Negative for color change, pallor and rash.  Neurological: Negative for dizziness, light-headedness, numbness and headaches.  Hematological: Negative for adenopathy. Does not bruise/bleed easily.  Psychiatric/Behavioral: Negative for suicidal ideas, confusion, sleep disturbance, self-injury, dysphoric mood, decreased concentration and agitation.      Objective:    BP 120/72 mmHg  Pulse 98  Temp(Src) 97.6 F (36.4 C) (Oral)  Ht 5\' 1"  (1.549 m)  Wt 129 lb 6.4 oz (58.695 kg)  BMI  24.46 kg/m2  SpO2 98%  LMP 07/02/2011 General appearance: alert, cooperative, appears stated age and no distress Head: Normocephalic, without obvious abnormality, atraumatic Eyes: conjunctivae/corneas clear. PERRL, EOM's intact. Fundi benign. Ears: normal TM's and external ear canals both ears Nose: Nares  normal. Septum midline. Mucosa normal. No drainage or sinus tenderness. Throat: lips, mucosa, and tongue normal; teeth and gums normal Neck: no adenopathy, no carotid bruit, no JVD, supple, symmetrical, trachea midline and thyroid not enlarged, symmetric, no tenderness/mass/nodules Back: symmetric, no curvature. ROM normal. No CVA tenderness. Lungs: clear to auscultation bilaterally Breasts: gyn Heart: regular rate and rhythm, S1, S2 normal, no murmur, click, rub or gallop Abdomen: soft, non-tender; bowel sounds normal; no masses,  no organomegaly Pelvic: deferred--gyn Extremities: extremities normal, atraumatic, no cyanosis or edema Pulses: 2+ and symmetric Skin: Skin color, texture, turgor normal. No rashes or lesions Lymph nodes: Cervical, supraclavicular, and axillary nodes normal. Neurologic: Alert and oriented X 3, normal strength and tone. Normal symmetric reflexes. Normal coordination and gait Psych- no depression       Assessment:    Healthy female exam.       Plan:     ghm utd Check labs See After Visit Summary for Counseling Recommendations   1. Hyperlipidemia Check labs--- pt off meds - Hepatic function panel - Lipid panel  2. Preventative health care See avs - Basic metabolic panel - CBC with Differential/Platelet - Hepatic function panel - Lipid panel - Microalbumin / creatinine urine ratio - POCT urinalysis dipstick - TSH

## 2014-12-13 NOTE — Patient Instructions (Signed)
Preventive Care for Adults A healthy lifestyle and preventive care can promote health and wellness. Preventive health guidelines for women include the following key practices.  A routine yearly physical is a good way to check with your health care provider about your health and preventive screening. It is a chance to share any concerns and updates on your health and to receive a thorough exam.  Visit your dentist for a routine exam and preventive care every 6 months. Brush your teeth twice a day and floss once a day. Good oral hygiene prevents tooth decay and gum disease.  The frequency of eye exams is based on your age, health, family medical history, use of contact lenses, and other factors. Follow your health care provider's recommendations for frequency of eye exams.  Eat a healthy diet. Foods like vegetables, fruits, whole grains, low-fat dairy products, and lean protein foods contain the nutrients you need without too many calories. Decrease your intake of foods high in solid fats, added sugars, and salt. Eat the right amount of calories for you.Get information about a proper diet from your health care provider, if necessary.  Regular physical exercise is one of the most important things you can do for your health. Most adults should get at least 150 minutes of moderate-intensity exercise (any activity that increases your heart rate and causes you to sweat) each week. In addition, most adults need muscle-strengthening exercises on 2 or more days a week.  Maintain a healthy weight. The body mass index (BMI) is a screening tool to identify possible weight problems. It provides an estimate of body fat based on height and weight. Your health care provider can find your BMI and can help you achieve or maintain a healthy weight.For adults 20 years and older:  A BMI below 18.5 is considered underweight.  A BMI of 18.5 to 24.9 is normal.  A BMI of 25 to 29.9 is considered overweight.  A BMI of  30 and above is considered obese.  Maintain normal blood lipids and cholesterol levels by exercising and minimizing your intake of saturated fat. Eat a balanced diet with plenty of fruit and vegetables. Blood tests for lipids and cholesterol should begin at age 76 and be repeated every 5 years. If your lipid or cholesterol levels are high, you are over 50, or you are at high risk for heart disease, you may need your cholesterol levels checked more frequently.Ongoing high lipid and cholesterol levels should be treated with medicines if diet and exercise are not working.  If you smoke, find out from your health care provider how to quit. If you do not use tobacco, do not start.  Lung cancer screening is recommended for adults aged 22-80 years who are at high risk for developing lung cancer because of a history of smoking. A yearly low-dose CT scan of the lungs is recommended for people who have at least a 30-pack-year history of smoking and are a current smoker or have quit within the past 15 years. A pack year of smoking is smoking an average of 1 pack of cigarettes a day for 1 year (for example: 1 pack a day for 30 years or 2 packs a day for 15 years). Yearly screening should continue until the smoker has stopped smoking for at least 15 years. Yearly screening should be stopped for people who develop a health problem that would prevent them from having lung cancer treatment.  If you are pregnant, do not drink alcohol. If you are breastfeeding,  be very cautious about drinking alcohol. If you are not pregnant and choose to drink alcohol, do not have more than 1 drink per day. One drink is considered to be 12 ounces (355 mL) of beer, 5 ounces (148 mL) of wine, or 1.5 ounces (44 mL) of liquor.  Avoid use of street drugs. Do not share needles with anyone. Ask for help if you need support or instructions about stopping the use of drugs.  High blood pressure causes heart disease and increases the risk of  stroke. Your blood pressure should be checked at least every 1 to 2 years. Ongoing high blood pressure should be treated with medicines if weight loss and exercise do not work.  If you are 3-86 years old, ask your health care provider if you should take aspirin to prevent strokes.  Diabetes screening involves taking a blood sample to check your fasting blood sugar level. This should be done once every 3 years, after age 67, if you are within normal weight and without risk factors for diabetes. Testing should be considered at a younger age or be carried out more frequently if you are overweight and have at least 1 risk factor for diabetes.  Breast cancer screening is essential preventive care for women. You should practice "breast self-awareness." This means understanding the normal appearance and feel of your breasts and may include breast self-examination. Any changes detected, no matter how small, should be reported to a health care provider. Women in their 8s and 30s should have a clinical breast exam (CBE) by a health care provider as part of a regular health exam every 1 to 3 years. After age 70, women should have a CBE every year. Starting at age 25, women should consider having a mammogram (breast X-ray test) every year. Women who have a family history of breast cancer should talk to their health care provider about genetic screening. Women at a high risk of breast cancer should talk to their health care providers about having an MRI and a mammogram every year.  Breast cancer gene (BRCA)-related cancer risk assessment is recommended for women who have family members with BRCA-related cancers. BRCA-related cancers include breast, ovarian, tubal, and peritoneal cancers. Having family members with these cancers may be associated with an increased risk for harmful changes (mutations) in the breast cancer genes BRCA1 and BRCA2. Results of the assessment will determine the need for genetic counseling and  BRCA1 and BRCA2 testing.  Routine pelvic exams to screen for cancer are no longer recommended for nonpregnant women who are considered low risk for cancer of the pelvic organs (ovaries, uterus, and vagina) and who do not have symptoms. Ask your health care provider if a screening pelvic exam is right for you.  If you have had past treatment for cervical cancer or a condition that could lead to cancer, you need Pap tests and screening for cancer for at least 20 years after your treatment. If Pap tests have been discontinued, your risk factors (such as having a new sexual partner) need to be reassessed to determine if screening should be resumed. Some women have medical problems that increase the chance of getting cervical cancer. In these cases, your health care provider may recommend more frequent screening and Pap tests.  The HPV test is an additional test that may be used for cervical cancer screening. The HPV test looks for the virus that can cause the cell changes on the cervix. The cells collected during the Pap test can be  tested for HPV. The HPV test could be used to screen women aged 30 years and older, and should be used in women of any age who have unclear Pap test results. After the age of 30, women should have HPV testing at the same frequency as a Pap test.  Colorectal cancer can be detected and often prevented. Most routine colorectal cancer screening begins at the age of 50 years and continues through age 75 years. However, your health care provider may recommend screening at an earlier age if you have risk factors for colon cancer. On a yearly basis, your health care provider may provide home test kits to check for hidden blood in the stool. Use of a small camera at the end of a tube, to directly examine the colon (sigmoidoscopy or colonoscopy), can detect the earliest forms of colorectal cancer. Talk to your health care provider about this at age 50, when routine screening begins. Direct  exam of the colon should be repeated every 5-10 years through age 75 years, unless early forms of pre-cancerous polyps or small growths are found.  People who are at an increased risk for hepatitis B should be screened for this virus. You are considered at high risk for hepatitis B if:  You were born in a country where hepatitis B occurs often. Talk with your health care provider about which countries are considered high risk.  Your parents were born in a high-risk country and you have not received a shot to protect against hepatitis B (hepatitis B vaccine).  You have HIV or AIDS.  You use needles to inject street drugs.  You live with, or have sex with, someone who has hepatitis B.  You get hemodialysis treatment.  You take certain medicines for conditions like cancer, organ transplantation, and autoimmune conditions.  Hepatitis C blood testing is recommended for all people born from 1945 through 1965 and any individual with known risks for hepatitis C.  Practice safe sex. Use condoms and avoid high-risk sexual practices to reduce the spread of sexually transmitted infections (STIs). STIs include gonorrhea, chlamydia, syphilis, trichomonas, herpes, HPV, and human immunodeficiency virus (HIV). Herpes, HIV, and HPV are viral illnesses that have no cure. They can result in disability, cancer, and death.  You should be screened for sexually transmitted illnesses (STIs) including gonorrhea and chlamydia if:  You are sexually active and are younger than 24 years.  You are older than 24 years and your health care provider tells you that you are at risk for this type of infection.  Your sexual activity has changed since you were last screened and you are at an increased risk for chlamydia or gonorrhea. Ask your health care provider if you are at risk.  If you are at risk of being infected with HIV, it is recommended that you take a prescription medicine daily to prevent HIV infection. This is  called preexposure prophylaxis (PrEP). You are considered at risk if:  You are a heterosexual woman, are sexually active, and are at increased risk for HIV infection.  You take drugs by injection.  You are sexually active with a partner who has HIV.  Talk with your health care provider about whether you are at high risk of being infected with HIV. If you choose to begin PrEP, you should first be tested for HIV. You should then be tested every 3 months for as long as you are taking PrEP.  Osteoporosis is a disease in which the bones lose minerals and strength   with aging. This can result in serious bone fractures or breaks. The risk of osteoporosis can be identified using a bone density scan. Women ages 65 years and over and women at risk for fractures or osteoporosis should discuss screening with their health care providers. Ask your health care provider whether you should take a calcium supplement or vitamin D to reduce the rate of osteoporosis.  Menopause can be associated with physical symptoms and risks. Hormone replacement therapy is available to decrease symptoms and risks. You should talk to your health care provider about whether hormone replacement therapy is right for you.  Use sunscreen. Apply sunscreen liberally and repeatedly throughout the day. You should seek shade when your shadow is shorter than you. Protect yourself by wearing long sleeves, pants, a wide-brimmed hat, and sunglasses year round, whenever you are outdoors.  Once a month, do a whole body skin exam, using a mirror to look at the skin on your back. Tell your health care provider of new moles, moles that have irregular borders, moles that are larger than a pencil eraser, or moles that have changed in shape or color.  Stay current with required vaccines (immunizations).  Influenza vaccine. All adults should be immunized every year.  Tetanus, diphtheria, and acellular pertussis (Td, Tdap) vaccine. Pregnant women should  receive 1 dose of Tdap vaccine during each pregnancy. The dose should be obtained regardless of the length of time since the last dose. Immunization is preferred during the 27th-36th week of gestation. An adult who has not previously received Tdap or who does not know her vaccine status should receive 1 dose of Tdap. This initial dose should be followed by tetanus and diphtheria toxoids (Td) booster doses every 10 years. Adults with an unknown or incomplete history of completing a 3-dose immunization series with Td-containing vaccines should begin or complete a primary immunization series including a Tdap dose. Adults should receive a Td booster every 10 years.  Varicella vaccine. An adult without evidence of immunity to varicella should receive 2 doses or a second dose if she has previously received 1 dose. Pregnant females who do not have evidence of immunity should receive the first dose after pregnancy. This first dose should be obtained before leaving the health care facility. The second dose should be obtained 4-8 weeks after the first dose.  Human papillomavirus (HPV) vaccine. Females aged 13-26 years who have not received the vaccine previously should obtain the 3-dose series. The vaccine is not recommended for use in pregnant females. However, pregnancy testing is not needed before receiving a dose. If a female is found to be pregnant after receiving a dose, no treatment is needed. In that case, the remaining doses should be delayed until after the pregnancy. Immunization is recommended for any person with an immunocompromised condition through the age of 26 years if she did not get any or all doses earlier. During the 3-dose series, the second dose should be obtained 4-8 weeks after the first dose. The third dose should be obtained 24 weeks after the first dose and 16 weeks after the second dose.  Zoster vaccine. One dose is recommended for adults aged 60 years or older unless certain conditions are  present.  Measles, mumps, and rubella (MMR) vaccine. Adults born before 1957 generally are considered immune to measles and mumps. Adults born in 1957 or later should have 1 or more doses of MMR vaccine unless there is a contraindication to the vaccine or there is laboratory evidence of immunity to   each of the three diseases. A routine second dose of MMR vaccine should be obtained at least 28 days after the first dose for students attending postsecondary schools, health care workers, or international travelers. People who received inactivated measles vaccine or an unknown type of measles vaccine during 1963-1967 should receive 2 doses of MMR vaccine. People who received inactivated mumps vaccine or an unknown type of mumps vaccine before 1979 and are at high risk for mumps infection should consider immunization with 2 doses of MMR vaccine. For females of childbearing age, rubella immunity should be determined. If there is no evidence of immunity, females who are not pregnant should be vaccinated. If there is no evidence of immunity, females who are pregnant should delay immunization until after pregnancy. Unvaccinated health care workers born before 1957 who lack laboratory evidence of measles, mumps, or rubella immunity or laboratory confirmation of disease should consider measles and mumps immunization with 2 doses of MMR vaccine or rubella immunization with 1 dose of MMR vaccine.  Pneumococcal 13-valent conjugate (PCV13) vaccine. When indicated, a person who is uncertain of her immunization history and has no record of immunization should receive the PCV13 vaccine. An adult aged 19 years or older who has certain medical conditions and has not been previously immunized should receive 1 dose of PCV13 vaccine. This PCV13 should be followed with a dose of pneumococcal polysaccharide (PPSV23) vaccine. The PPSV23 vaccine dose should be obtained at least 8 weeks after the dose of PCV13 vaccine. An adult aged 19  years or older who has certain medical conditions and previously received 1 or more doses of PPSV23 vaccine should receive 1 dose of PCV13. The PCV13 vaccine dose should be obtained 1 or more years after the last PPSV23 vaccine dose.  Pneumococcal polysaccharide (PPSV23) vaccine. When PCV13 is also indicated, PCV13 should be obtained first. All adults aged 65 years and older should be immunized. An adult younger than age 65 years who has certain medical conditions should be immunized. Any person who resides in a nursing home or long-term care facility should be immunized. An adult smoker should be immunized. People with an immunocompromised condition and certain other conditions should receive both PCV13 and PPSV23 vaccines. People with human immunodeficiency virus (HIV) infection should be immunized as soon as possible after diagnosis. Immunization during chemotherapy or radiation therapy should be avoided. Routine use of PPSV23 vaccine is not recommended for American Indians, [place] Natives, or people younger than 65 years unless there are medical conditions that require PPSV23 vaccine. When indicated, people who have unknown immunization and have no record of immunization should receive PPSV23 vaccine. One-time revaccination 5 years after the first dose of PPSV23 is recommended for people aged 19-64 years who have chronic kidney failure, nephrotic syndrome, asplenia, or immunocompromised conditions. People who received 1-2 doses of PPSV23 before age 65 years should receive another dose of PPSV23 vaccine at age 65 years or later if at least 5 years have passed since the previous dose. Doses of PPSV23 are not needed for people immunized with PPSV23 at or after age 65 years.  Meningococcal vaccine. Adults with asplenia or persistent complement component deficiencies should receive 2 doses of quadrivalent meningococcal conjugate (MenACWY-D) vaccine. The doses should be obtained at least 2 months apart.  Microbiologists working with certain meningococcal bacteria, military recruits, people at risk during an outbreak, and people who travel to or live in countries with a high rate of meningitis should be immunized. A first-year college student up through age   21 years who is living in a residence hall should receive a dose if she did not receive a dose on or after her 16th birthday. Adults who have certain high-risk conditions should receive one or more doses of vaccine.  Hepatitis A vaccine. Adults who wish to be protected from this disease, have certain high-risk conditions, work with hepatitis A-infected animals, work in hepatitis A research labs, or travel to or work in countries with a high rate of hepatitis A should be immunized. Adults who were previously unvaccinated and who anticipate close contact with an international adoptee during the first 60 days after arrival in the [place] from a country with a high rate of hepatitis A should be immunized.  Hepatitis B vaccine. Adults who wish to be protected from this disease, have certain high-risk conditions, may be exposed to blood or other infectious body fluids, are household contacts or sex partners of hepatitis B positive people, are clients or workers in certain care facilities, or travel to or work in countries with a high rate of hepatitis B should be immunized.  Haemophilus influenzae type b (Hib) vaccine. A previously unvaccinated person with asplenia or sickle cell disease or having a scheduled splenectomy should receive 1 dose of Hib vaccine. Regardless of previous immunization, a recipient of a hematopoietic stem cell transplant should receive a 3-dose series 6-12 months after her successful transplant. Hib vaccine is not recommended for adults with HIV infection. Preventive Services / Frequency Ages 64 to 68 years  Blood pressure check.** / Every 1 to 2 years.  Lipid and cholesterol check.** / Every 5 years beginning at age  22.  Clinical breast exam.** / Every 3 years for women in their 88s and 53s.  BRCA-related cancer risk assessment.** / For women who have family members with a BRCA-related cancer (breast, ovarian, tubal, or peritoneal cancers).  Pap test.** / Every 2 years from ages 90 through 51. Every 3 years starting at age 21 through age 56 or 3 with a history of 3 consecutive normal Pap tests.  HPV screening.** / Every 3 years from ages 24 through ages 1 to 46 with a history of 3 consecutive normal Pap tests.  Hepatitis C blood test.** / For any individual with known risks for hepatitis C.  Skin self-exam. / Monthly.  Influenza vaccine. / Every year.  Tetanus, diphtheria, and acellular pertussis (Tdap, Td) vaccine.** / Consult your health care provider. Pregnant women should receive 1 dose of Tdap vaccine during each pregnancy. 1 dose of Td every 10 years.  Varicella vaccine.** / Consult your health care provider. Pregnant females who do not have evidence of immunity should receive the first dose after pregnancy.  HPV vaccine. / 3 doses over 6 months, if 72 and younger. The vaccine is not recommended for use in pregnant females. However, pregnancy testing is not needed before receiving a dose.  Measles, mumps, rubella (MMR) vaccine.** / You need at least 1 dose of MMR if you were born in 1957 or later. You may also need a 2nd dose. For females of childbearing age, rubella immunity should be determined. If there is no evidence of immunity, females who are not pregnant should be vaccinated. If there is no evidence of immunity, females who are pregnant should delay immunization until after pregnancy.  Pneumococcal 13-valent conjugate (PCV13) vaccine.** / Consult your health care provider.  Pneumococcal polysaccharide (PPSV23) vaccine.** / 1 to 2 doses if you smoke cigarettes or if you have certain conditions.  Meningococcal vaccine.** /  1 dose if you are age 19 to 21 years and a first-year college  student living in a residence hall, or have one of several medical conditions, you need to get vaccinated against meningococcal disease. You may also need additional booster doses.  Hepatitis A vaccine.** / Consult your health care provider.  Hepatitis B vaccine.** / Consult your health care provider.  Haemophilus influenzae type b (Hib) vaccine.** / Consult your health care provider. Ages 40 to 64 years  Blood pressure check.** / Every 1 to 2 years.  Lipid and cholesterol check.** / Every 5 years beginning at age 20 years.  Lung cancer screening. / Every year if you are aged 55-80 years and have a 30-pack-year history of smoking and currently smoke or have quit within the past 15 years. Yearly screening is stopped once you have quit smoking for at least 15 years or develop a health problem that would prevent you from having lung cancer treatment.  Clinical breast exam.** / Every year after age 40 years.  BRCA-related cancer risk assessment.** / For women who have family members with a BRCA-related cancer (breast, ovarian, tubal, or peritoneal cancers).  Mammogram.** / Every year beginning at age 40 years and continuing for as long as you are in good health. Consult with your health care provider.  Pap test.** / Every 3 years starting at age 30 years through age 65 or 70 years with a history of 3 consecutive normal Pap tests.  HPV screening.** / Every 3 years from ages 30 years through ages 65 to 70 years with a history of 3 consecutive normal Pap tests.  Fecal occult blood test (FOBT) of stool. / Every year beginning at age 50 years and continuing until age 75 years. You may not need to do this test if you get a colonoscopy every 10 years.  Flexible sigmoidoscopy or colonoscopy.** / Every 5 years for a flexible sigmoidoscopy or every 10 years for a colonoscopy beginning at age 50 years and continuing until age 75 years.  Hepatitis C blood test.** / For all people born from 1945 through  1965 and any individual with known risks for hepatitis C.  Skin self-exam. / Monthly.  Influenza vaccine. / Every year.  Tetanus, diphtheria, and acellular pertussis (Tdap/Td) vaccine.** / Consult your health care provider. Pregnant women should receive 1 dose of Tdap vaccine during each pregnancy. 1 dose of Td every 10 years.  Varicella vaccine.** / Consult your health care provider. Pregnant females who do not have evidence of immunity should receive the first dose after pregnancy.  Zoster vaccine.** / 1 dose for adults aged 60 years or older.  Measles, mumps, rubella (MMR) vaccine.** / You need at least 1 dose of MMR if you were born in 1957 or later. You may also need a 2nd dose. For females of childbearing age, rubella immunity should be determined. If there is no evidence of immunity, females who are not pregnant should be vaccinated. If there is no evidence of immunity, females who are pregnant should delay immunization until after pregnancy.  Pneumococcal 13-valent conjugate (PCV13) vaccine.** / Consult your health care provider.  Pneumococcal polysaccharide (PPSV23) vaccine.** / 1 to 2 doses if you smoke cigarettes or if you have certain conditions.  Meningococcal vaccine.** / Consult your health care provider.  Hepatitis A vaccine.** / Consult your health care provider.  Hepatitis B vaccine.** / Consult your health care provider.  Haemophilus influenzae type b (Hib) vaccine.** / Consult your health care provider. Ages 65   years and over  Blood pressure check.** / Every 1 to 2 years.  Lipid and cholesterol check.** / Every 5 years beginning at age 22 years.  Lung cancer screening. / Every year if you are aged 73-80 years and have a 30-pack-year history of smoking and currently smoke or have quit within the past 15 years. Yearly screening is stopped once you have quit smoking for at least 15 years or develop a health problem that would prevent you from having lung cancer  treatment.  Clinical breast exam.** / Every year after age 4 years.  BRCA-related cancer risk assessment.** / For women who have family members with a BRCA-related cancer (breast, ovarian, tubal, or peritoneal cancers).  Mammogram.** / Every year beginning at age 40 years and continuing for as long as you are in good health. Consult with your health care provider.  Pap test.** / Every 3 years starting at age 9 years through age 34 or 91 years with 3 consecutive normal Pap tests. Testing can be stopped between 65 and 70 years with 3 consecutive normal Pap tests and no abnormal Pap or HPV tests in the past 10 years.  HPV screening.** / Every 3 years from ages 57 years through ages 64 or 45 years with a history of 3 consecutive normal Pap tests. Testing can be stopped between 65 and 70 years with 3 consecutive normal Pap tests and no abnormal Pap or HPV tests in the past 10 years.  Fecal occult blood test (FOBT) of stool. / Every year beginning at age 15 years and continuing until age 17 years. You may not need to do this test if you get a colonoscopy every 10 years.  Flexible sigmoidoscopy or colonoscopy.** / Every 5 years for a flexible sigmoidoscopy or every 10 years for a colonoscopy beginning at age 86 years and continuing until age 71 years.  Hepatitis C blood test.** / For all people born from [birth year] through 1963 and any individual with known risks for hepatitis C.  Osteoporosis screening.** / A one-time screening for women ages 83 years and over and women at risk for fractures or osteoporosis.  Skin self-exam. / Monthly.  Influenza vaccine. / Every year.  Tetanus, diphtheria, and acellular pertussis (Tdap/Td) vaccine.** / 1 dose of Td every 10 years.  Varicella vaccine.** / Consult your health care provider.  Zoster vaccine.** / 1 dose for adults aged 61 years or older.  Pneumococcal 13-valent conjugate (PCV13) vaccine.** / Consult your health care provider.  Pneumococcal  polysaccharide (PPSV23) vaccine.** / 1 dose for all adults aged 28 years and older.  Meningococcal vaccine.** / Consult your health care provider.  Hepatitis A vaccine.** / Consult your health care provider.  Hepatitis B vaccine.** / Consult your health care provider.  Haemophilus influenzae type b (Hib) vaccine.** / Consult your health care provider. ** Family history and personal history of risk and conditions may change your health care provider's recommendations. Document Released: 07/20/2001 Document Revised: 10/08/2013 Document Reviewed: 10/19/2010 [facility] Patient Information 2015 [place], [place]. This information is not intended to replace advice given to you by your health care provider. Make sure you discuss any questions you have with your health care provider.

## 2014-12-30 ENCOUNTER — Other Ambulatory Visit: Payer: Self-pay | Admitting: Family Medicine

## 2015-01-29 ENCOUNTER — Encounter (HOSPITAL_BASED_OUTPATIENT_CLINIC_OR_DEPARTMENT_OTHER): Payer: Self-pay | Admitting: *Deleted

## 2015-01-29 DIAGNOSIS — R42 Dizziness and giddiness: Secondary | ICD-10-CM | POA: Insufficient documentation

## 2015-01-29 DIAGNOSIS — Z79899 Other long term (current) drug therapy: Secondary | ICD-10-CM | POA: Diagnosis not present

## 2015-01-29 DIAGNOSIS — R079 Chest pain, unspecified: Secondary | ICD-10-CM | POA: Diagnosis not present

## 2015-01-29 DIAGNOSIS — R110 Nausea: Secondary | ICD-10-CM | POA: Insufficient documentation

## 2015-01-29 DIAGNOSIS — Z87448 Personal history of other diseases of urinary system: Secondary | ICD-10-CM | POA: Insufficient documentation

## 2015-01-29 DIAGNOSIS — E039 Hypothyroidism, unspecified: Secondary | ICD-10-CM | POA: Insufficient documentation

## 2015-01-29 DIAGNOSIS — E785 Hyperlipidemia, unspecified: Secondary | ICD-10-CM | POA: Diagnosis not present

## 2015-01-29 DIAGNOSIS — R531 Weakness: Secondary | ICD-10-CM | POA: Insufficient documentation

## 2015-01-29 LAB — CBG MONITORING, ED: Glucose-Capillary: 130 mg/dL — ABNORMAL HIGH (ref 65–99)

## 2015-01-29 NOTE — ED Notes (Addendum)
Pt reports weakness and nausea that started 1 hour PTA.  Pt ambulatory, no distress noted.  Denies pain. Pt was exposed to mold one week ago, pt been feeling lightheaded since that time.

## 2015-01-30 ENCOUNTER — Emergency Department (HOSPITAL_BASED_OUTPATIENT_CLINIC_OR_DEPARTMENT_OTHER)
Admission: EM | Admit: 2015-01-30 | Discharge: 2015-01-30 | Disposition: A | Discharge status: Home / Self Care | Payer: BLUE CROSS/BLUE SHIELD | Attending: Emergency Medicine | Admitting: Emergency Medicine

## 2015-01-30 ENCOUNTER — Emergency Department (HOSPITAL_BASED_OUTPATIENT_CLINIC_OR_DEPARTMENT_OTHER): Payer: BLUE CROSS/BLUE SHIELD

## 2015-01-30 ENCOUNTER — Ambulatory Visit (INDEPENDENT_AMBULATORY_CARE_PROVIDER_SITE_OTHER): Payer: BLUE CROSS/BLUE SHIELD | Admitting: Family Medicine

## 2015-01-30 ENCOUNTER — Encounter: Payer: Self-pay | Admitting: Family Medicine

## 2015-01-30 VITALS — BP 110/68 | HR 80 | Temp 99.4°F | Wt 134.0 lb

## 2015-01-30 DIAGNOSIS — R42 Dizziness and giddiness: Secondary | ICD-10-CM | POA: Diagnosis not present

## 2015-01-30 DIAGNOSIS — R079 Chest pain, unspecified: Secondary | ICD-10-CM

## 2015-01-30 DIAGNOSIS — H8110 Benign paroxysmal vertigo, unspecified ear: Secondary | ICD-10-CM | POA: Diagnosis not present

## 2015-01-30 LAB — CBC WITH DIFFERENTIAL/PLATELET
Basophils Absolute: 0.1 10*3/uL (ref 0.0–0.1)
Basophils Relative: 1 % (ref 0–1)
Eosinophils Absolute: 0.1 10*3/uL (ref 0.0–0.7)
Eosinophils Relative: 3 % (ref 0–5)
HCT: 43.4 % (ref 36.0–46.0)
Hemoglobin: 14.6 g/dL (ref 12.0–15.0)
Lymphocytes Relative: 33 % (ref 12–46)
Lymphs Abs: 1.7 10*3/uL (ref 0.7–4.0)
MCH: 31.2 pg (ref 26.0–34.0)
MCHC: 33.6 g/dL (ref 30.0–36.0)
MCV: 92.7 fL (ref 78.0–100.0)
Monocytes Absolute: 0.6 10*3/uL (ref 0.1–1.0)
Monocytes Relative: 11 % (ref 3–12)
Neutro Abs: 2.8 10*3/uL (ref 1.7–7.7)
Neutrophils Relative %: 52 % (ref 43–77)
Platelets: 234 10*3/uL (ref 150–400)
RBC: 4.68 MIL/uL (ref 3.87–5.11)
RDW: 11.6 % (ref 11.5–15.5)
WBC: 5.3 10*3/uL (ref 4.0–10.5)

## 2015-01-30 LAB — BASIC METABOLIC PANEL
Anion gap: 8 (ref 5–15)
BUN: 21 mg/dL — ABNORMAL HIGH (ref 6–20)
CO2: 29 mmol/L (ref 22–32)
Calcium: 9.6 mg/dL (ref 8.9–10.3)
Chloride: 100 mmol/L — ABNORMAL LOW (ref 101–111)
Creatinine, Ser: 0.78 mg/dL (ref 0.44–1.00)
GFR calc Af Amer: >60 mL/min (ref >60)
GFR calc non Af Amer: >60 mL/min (ref >60)
Glucose, Bld: 133 mg/dL — ABNORMAL HIGH (ref 65–99)
Potassium: 4.3 mmol/L (ref 3.5–5.1)
Sodium: 137 mmol/L (ref 135–145)

## 2015-01-30 LAB — TROPONIN I: Troponin I: <0.03 ng/mL (ref <0.031)

## 2015-01-30 MED ORDER — MECLIZINE HCL 25 MG PO TABS
25.0000 mg | ORAL_TABLET | Freq: Once | ORAL | Status: AC
  Administered 2015-01-30: 25 mg via ORAL
  Filled 2015-01-30: qty 1

## 2015-01-30 MED ORDER — FLUTICASONE PROPIONATE 50 MCG/ACT NA SUSP: 2.0000 | Freq: Every day | NASAL | Status: DC

## 2015-01-30 MED ORDER — MECLIZINE HCL 25 MG PO TABS: 25.0000 mg | ORAL_TABLET | Freq: Three times a day (TID) | ORAL | Status: DC | PRN

## 2015-01-30 NOTE — Progress Notes (Deleted)
Patient ID: Stacey [name], female   DOB: 1960/10/01, 54 y.o.   MRN: [id number]   Subjective:    Patient ID: Stacey [name], female    DOB: 1960-09-25, 54 y.o.   MRN: [id number]  Chief Complaint  Patient presents with  . Dizziness    and bilateral ear fullness x's a few weeks    HPI Patient is in today for ***  Past Medical History  Diagnosis Date  . Hyperlipidemia   . 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 Thyroid disease 2008    HYPOTHYROIDISM  . Pollen allergies   . Allergy     Past Surgical History  Procedure Laterality Date  . Cesarean section  1992  . Popliteal synovial cyst excision Left 1974    Family History  Problem Relation Age of Onset  . Prostate cancer    . Cancer Father 15    prostate  . Diabetes Daughter   . Celiac disease Daughter   . Anuerysm Maternal Grandfather   . Diabetes Daughter   . Colon cancer Neg Hx     Social History   Social History  . Marital Status: Married    Spouse Name: N/A  . Number of Children: 2  . Years of Education: N/A   Occupational History  . Training and development officer    Social History Main Topics  . Smoking status: Never Smoker   . Smokeless tobacco: Never Used  . Alcohol Use: No  . Drug Use: No  . Sexual Activity:    Partners: Male    Birth Control/ Protection: Condom   Other Topics Concern  . Not on file   Social History Narrative    Outpatient Prescriptions Prior to Visit  Medication Sig Dispense Refill  . cetirizine (ZYRTEC) 10 MG tablet Take 10 mg by mouth daily.    . Cholecalciferol (VITAMIN D) 1000 UNITS capsule Take 1,000 Units by mouth daily.      . Cyanocobalamin (B-12) 2500 MCG TABS Take 1 tablet by mouth daily.    Marland Kitchen levothyroxine (SYNTHROID, LEVOTHROID) 75 MCG tablet TAKE ONE TABLET BY MOUTH ONCE DAILY BEFORE BREAKFAST 30 tablet 5  . meclizine (ANTIVERT) 25 MG tablet Take 1 tablet (25 mg total) by mouth 3 (three) times daily as needed for dizziness. 30 tablet 0  . Multiple Vitamin (MULTIVITAMIN) tablet Take 1 tablet by  mouth daily.       No facility-administered medications prior to visit.    Allergies  Allergen Reactions  . Other     TREES, POLLEN, DUST, MOLD AND ANIMALS    Review of Systems  Constitutional: Negative for fever and malaise/fatigue.  HENT: Negative for congestion.   Eyes: Negative for discharge.  Respiratory: Negative for shortness of breath.   Cardiovascular: Negative for chest pain, palpitations and leg swelling.  Gastrointestinal: Negative for nausea and abdominal pain.  Genitourinary: Negative for dysuria.  Musculoskeletal: Negative for falls.  Skin: Negative for rash.  Neurological: Positive for dizziness. Negative for loss of consciousness and headaches.  Endo/Heme/Allergies: Negative for environmental allergies.  Psychiatric/Behavioral: Negative for depression. The patient is not nervous/anxious.        Objective:    Physical Exam  BP 110/68 mmHg  Pulse 80  Temp(Src) 99.4 F (37.4 C) (Oral)  Wt 134 lb (60.782 kg)  SpO2 97%  LMP 07/02/2011 Wt Readings from Last 3 Encounters:  01/30/15 134 lb (60.782 kg)  01/29/15 130 lb (58.968 kg)  12/13/14 129 lb 6.4 oz (58.695 kg)     Lab Results  Component Value  Date   WBC 5.3 01/30/2015   HGB 14.6 01/30/2015   HCT 43.4 01/30/2015   PLT 234 01/30/2015   GLUCOSE 133* 01/30/2015   CHOL 230* 12/13/2014   TRIG 89.0 12/13/2014   HDL 66.50 12/13/2014   LDLCALC 146* 12/13/2014   ALT 16 12/13/2014   AST 23 12/13/2014   NA 137 01/30/2015   K 4.3 01/30/2015   CL 100* 01/30/2015   CREATININE 0.78 01/30/2015   BUN 21* 01/30/2015   CO2 29 01/30/2015   TSH 2.26 12/13/2014   MICROALBUR 0.7 12/13/2014    Lab Results  Component Value Date   TSH 2.26 12/13/2014   Lab Results  Component Value Date   WBC 5.3 01/30/2015   HGB 14.6 01/30/2015   HCT 43.4 01/30/2015   MCV 92.7 01/30/2015   PLT 234 01/30/2015   Lab Results  Component Value Date   NA 137 01/30/2015   K 4.3 01/30/2015   CO2 29 01/30/2015   GLUCOSE  133* 01/30/2015   BUN 21* 01/30/2015   CREATININE 0.78 01/30/2015   BILITOT 0.6 12/13/2014   ALKPHOS 73 12/13/2014   AST 23 12/13/2014   ALT 16 12/13/2014   PROT 7.2 12/13/2014   ALBUMIN 4.2 12/13/2014   CALCIUM 9.6 01/30/2015   ANIONGAP 8 01/30/2015   GFR 77.73 12/13/2014   Lab Results  Component Value Date   CHOL 230* 12/13/2014   Lab Results  Component Value Date   HDL 66.50 12/13/2014   Lab Results  Component Value Date   LDLCALC 146* 12/13/2014   Lab Results  Component Value Date   TRIG 89.0 12/13/2014   Lab Results  Component Value Date   CHOLHDL 3 12/13/2014   No results found for: HGBA1C     Assessment & Plan:   Problem List Items Addressed This Visit    None      I am having Stacey [name] maintain her Vitamin D, multivitamin, cetirizine, B-12, levothyroxine, and meclizine.  No orders of the defined types were placed in this encounter.     Elizabeth Sauer, LPN

## 2015-01-30 NOTE — Patient Instructions (Signed)
Epley Maneuver Self-Care WHAT IS THE EPLEY MANEUVER? The Epley maneuver is an exercise you can do to relieve symptoms of benign paroxysmal positional vertigo (BPPV). This condition is often just referred to as vertigo. BPPV is caused by the movement of tiny crystals (canaliths) inside your inner ear. The accumulation and movement of canaliths in your inner ear causes a sudden spinning sensation (vertigo) when you move your head to certain positions. Vertigo usually lasts about 30 seconds. BPPV usually occurs in just one ear. If you get vertigo when you lie on your left side, you probably have BPPV in your left ear. Your health care provider can tell you which ear is involved.  BPPV may be caused by a head injury. Many people older than 50 get BPPV for unknown reasons. If you have been diagnosed with BPPV, your health care provider may teach you how to do this maneuver. BPPV is not life threatening (benign) and usually goes away in time.  WHEN SHOULD I PERFORM THE EPLEY MANEUVER? You can do this maneuver at home whenever you have symptoms of vertigo. You may do the Epley maneuver up to 3 times a day until your symptoms of vertigo go away. HOW SHOULD I DO THE EPLEY MANEUVER? 1. Sit on the edge of a bed or table with your back straight. Your legs should be extended or hanging over the edge of the bed or table.  2. Turn your head halfway toward the affected ear.  3. Lie backward quickly with your head turned until you are lying flat on your back. You may want to position a pillow under your shoulders.  4. Hold this position for 30 seconds. You may experience an attack of vertigo. This is normal. Hold this position until the vertigo stops. 5. Then turn your head to the opposite direction until your unaffected ear is facing the floor.  6. Hold this position for 30 seconds. You may experience an attack of vertigo. This is normal. Hold this position until the vertigo stops. 7. Now turn your whole body to  the same side as your head. Hold for another 30 seconds.  8. You can then sit back up. ARE THERE RISKS TO THIS MANEUVER? In some cases, you may have other symptoms (such as changes in your vision, weakness, or numbness). If you have these symptoms, stop doing the maneuver and call your health care provider. Even if doing these maneuvers relieves your vertigo, you may still have dizziness. Dizziness is the sensation of light-headedness but without the sensation of movement. Even though the Epley maneuver may relieve your vertigo, it is possible that your symptoms will return within 5 years. WHAT SHOULD I DO AFTER THIS MANEUVER? After doing the Epley maneuver, you can return to your normal activities. Ask your doctor if there is anything you should do at home to prevent vertigo. This may include:  Sleeping with two or more pillows to keep your head elevated.  Not sleeping on the side of your affected ear.  Getting up slowly from bed.  Avoiding sudden movements during the day.  Avoiding extreme head movement, like looking up or bending over.  Wearing a cervical collar to prevent sudden head movements. WHAT SHOULD I DO IF MY SYMPTOMS GET WORSE? Call your health care provider if your vertigo gets worse. Call your provider right way if you have other symptoms, including:   Nausea.  Vomiting.  Headache.  Weakness.  Numbness.  Vision changes. Document Released: 05/29/2013 Document Reviewed: 05/29/2013 [employer]   Patient Information 2015 [employer]. This information is not intended to replace advice given to you by your health care provider. Make sure you discuss any questions you have with your health care provider.  

## 2015-01-30 NOTE — Discharge Instructions (Signed)
You were seen today for dizziness and generalized weakness. He also noted some chest pain. Workup is reassuring. Your low risk for heart disease. Your dizziness may be related to benign positional vertigo. He'll be given meclizine. Follow-up with your primary Dr. for ENT referral if your symptoms persist.   Benign Positional Vertigo Vertigo means you feel like you or your surroundings are moving when they are not. Benign positional vertigo is the most common form of vertigo. Benign means that the cause of your condition is not serious. Benign positional vertigo is more common in older adults. CAUSES  Benign positional vertigo is the result of an upset in the labyrinth system. This is an area in the middle ear that helps control your balance. This may be caused by a viral infection, head injury, or repetitive motion. However, often no specific cause is found. SYMPTOMS  Symptoms of benign positional vertigo occur when you move your head or eyes in different directions. Some of the symptoms may include:  Loss of balance and falls.  Vomiting.  Blurred vision.  Dizziness.  Nausea.  Involuntary eye movements (nystagmus). DIAGNOSIS  Benign positional vertigo is usually diagnosed by physical exam. If the specific cause of your benign positional vertigo is unknown, your caregiver may perform imaging tests, such as magnetic resonance imaging (MRI) or computed tomography (CT). TREATMENT  Your caregiver may recommend movements or procedures to correct the benign positional vertigo. Medicines such as meclizine, benzodiazepines, and medicines for nausea may be used to treat your symptoms. In rare cases, if your symptoms are caused by certain conditions that affect the inner ear, you may need surgery. HOME CARE INSTRUCTIONS   Follow your caregiver's instructions.  Move slowly. Do not make sudden body or head movements.  Avoid driving.  Avoid operating heavy machinery.  Avoid performing any tasks  that would be dangerous to you or others during a vertigo episode.  Drink enough fluids to keep your urine clear or pale yellow. SEEK IMMEDIATE MEDICAL CARE IF:   You develop problems with walking, weakness, numbness, or using your arms, hands, or legs.  You have difficulty speaking.  You develop severe headaches.  Your nausea or vomiting continues or gets worse.  You develop visual changes.  Your family or friends notice any behavioral changes.  Your condition gets worse.  You have a fever.  You develop a stiff neck or sensitivity to light. MAKE SURE YOU:   Understand these instructions.  Will watch your condition.  Will get help right away if you are not doing well or get worse. Document Released: 03/01/2006 Document Revised: 08/16/2011 Document Reviewed: 02/11/2011 [facility] Patient Information 2015 [place], [place]. This information is not intended to replace advice given to you by your health care provider. Make sure you discuss any questions you have with your health care provider.  Chest Pain (Nonspecific) It is often hard to give a diagnosis for the cause of chest pain. There is always a chance that your pain could be related to something serious, such as a heart attack or a blood clot in the lungs. You need to follow up with your doctor. HOME CARE  If antibiotic medicine was given, take it as directed by your doctor. Finish the medicine even if you start to feel better.  For the next few days, avoid activities that bring on chest pain. Continue physical activities as told by your doctor.  Do not use any tobacco products. This includes cigarettes, chewing tobacco, and e-cigarettes.  Avoid drinking alcohol.  Only take medicine as told by your doctor.  Follow your doctor's suggestions for more testing if your chest pain does not go away.  Keep all doctor visits you made. GET HELP IF:  Your chest pain does not go away, even after treatment.  You have a rash  with blisters on your chest.  You have a fever. GET HELP RIGHT AWAY IF:   You have more pain or pain that spreads to your arm, neck, jaw, back, or belly (abdomen).  You have shortness of breath.  You cough more than usual or cough up blood.  You have very bad back or belly pain.  You feel sick to your stomach (nauseous) or throw up (vomit).  You have very bad weakness.  You pass out (faint).  You have chills. This is an emergency. Do not wait to see if the problems will go away. Call your local emergency services (911 in [place] Do not drive yourself to the hospital. MAKE SURE YOU:   Understand these instructions.  Will watch your condition.  Will get help right away if you are not doing well or get worse. Document Released: 11/10/2007 Document Revised: 05/29/2013 Document Reviewed: 11/10/2007 [facility] Patient Information 2015 [place], [place]. This information is not intended to replace advice given to you by your health care provider. Make sure you discuss any questions you have with your health care provider.

## 2015-01-30 NOTE — Progress Notes (Signed)
Patient ID: Stacey [name], female    DOB: 1960-08-05  Age: 54 y.o. MRN: [id number]    Subjective:  Subjective HPI Stacey [name] presents with c/o dizziness x a few weeks.  No congestion. No fever.    Review of Systems  Constitutional: Negative for diaphoresis, appetite change, fatigue and unexpected weight change.  Eyes: Negative for pain, redness and visual disturbance.  Respiratory: Negative for cough, chest tightness, shortness of breath and wheezing.   Cardiovascular: Negative for chest pain, palpitations and leg swelling.  Endocrine: Negative for cold intolerance, heat intolerance, polydipsia, polyphagia and polyuria.  Genitourinary: Negative for dysuria, frequency and difficulty urinating.  Neurological: Positive for dizziness and light-headedness. Negative for numbness and headaches.    History Past Medical History  Diagnosis Date  . Hyperlipidemia   . 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 Thyroid disease 2008    HYPOTHYROIDISM  . Pollen allergies   . Allergy     She has past surgical history that includes Cesarean section (1992) and Popliteal synovial cyst excision (Left, 1974).   Her family history includes Anuerysm in her maternal grandfather; Cancer (age of onset: 29) in her father; Celiac disease in her daughter; Diabetes in her daughter and daughter; Prostate cancer in an other family member. There is no history of Colon cancer.She reports that she has never smoked. She has never used smokeless tobacco. She reports that she does not drink alcohol or use illicit drugs.  Current Outpatient Prescriptions on File Prior to Visit  Medication Sig Dispense Refill  . cetirizine (ZYRTEC) 10 MG tablet Take 10 mg by mouth daily.    . Cholecalciferol (VITAMIN D) 1000 UNITS capsule Take 1,000 Units by mouth daily.      . Cyanocobalamin (B-12) 2500 MCG TABS Take 1 tablet by mouth daily.    Marland Kitchen levothyroxine (SYNTHROID, LEVOTHROID) 75 MCG tablet TAKE ONE TABLET BY MOUTH ONCE DAILY BEFORE BREAKFAST 30  tablet 5  . Multiple Vitamin (MULTIVITAMIN) tablet Take 1 tablet by mouth daily.       No current facility-administered medications on file prior to visit.     Objective:  Objective Physical Exam  Constitutional: She is oriented to person, place, and time. She appears well-developed and well-nourished.  HENT:  Head: Normocephalic and atraumatic.  Eyes: Conjunctivae and EOM are normal.  Neck: Normal range of motion. Neck supple. No JVD present. Carotid bruit is not present. No thyromegaly present.  Cardiovascular: Normal rate, regular rhythm and normal heart sounds.   No murmur heard. Pulmonary/Chest: Effort normal and breath sounds normal. No respiratory distress. She has no wheezes. She has no rales. She exhibits no tenderness.  Musculoskeletal: She exhibits no edema.  Neurological: She is alert and oriented to person, place, and time.  Psychiatric: She has a normal mood and affect.  Nursing note and vitals reviewed.  BP 110/68 mmHg  Pulse 80  Temp(Src) 99.4 F (37.4 C) (Oral)  Wt 134 lb (60.782 kg)  SpO2 97%  LMP 07/02/2011 Wt Readings from Last 3 Encounters:  01/30/15 134 lb (60.782 kg)  01/29/15 130 lb (58.968 kg)  12/13/14 129 lb 6.4 oz (58.695 kg)     Lab Results  Component Value Date   WBC 5.3 01/30/2015   HGB 14.6 01/30/2015   HCT 43.4 01/30/2015   PLT 234 01/30/2015   GLUCOSE 133* 01/30/2015   CHOL 230* 12/13/2014   TRIG 89.0 12/13/2014   HDL 66.50 12/13/2014   LDLCALC 146* 12/13/2014   ALT 16 12/13/2014   AST 23 12/13/2014  NA 137 01/30/2015   K 4.3 01/30/2015   CL 100* 01/30/2015   CREATININE 0.78 01/30/2015   BUN 21* 01/30/2015   CO2 29 01/30/2015   TSH 2.26 12/13/2014   MICROALBUR 0.7 12/13/2014    Dg Chest 2 View  01/30/2015   CLINICAL DATA:  Dizziness. Light internal tremors. Symphysis for 1 day. Some chest discomfort. Nonsmoker.  EXAM: CHEST  2 VIEW  COMPARISON:  None.  FINDINGS: The heart size and mediastinal contours are within normal  limits. Both lungs are clear. The visualized skeletal structures are unremarkable.  IMPRESSION: No active cardiopulmonary disease.   Electronically Signed   By: Lucienne Capers M.D.   On: 01/30/2015 01:57     Assessment & Plan:  Plan I am having Ms. Picone start on fluticasone. I am also having her maintain her Vitamin D, multivitamin, cetirizine, B-12, levothyroxine, and meclizine.  Meds ordered this encounter  Medications  . meclizine (ANTIVERT) 25 MG tablet    Sig: Take 1 tablet (25 mg total) by mouth 3 (three) times daily as needed for dizziness.    Dispense:  30 tablet    Refill:  0  . fluticasone (FLONASE) 50 MCG/ACT nasal spray    Sig: Place 2 sprays into both nostrils daily.    Dispense:  16 g    Refill:  6    Problem List Items Addressed This Visit    Benign paroxysmal positional vertigo    ? Allergies Flonase, antihistamine meclazine prn rto prn       Other Visit Diagnoses    Vertigo    -  Primary       Follow-up: Return if symptoms worsen or fail to improve.  Garnet Koyanagi, DO

## 2015-01-30 NOTE — ED Provider Notes (Signed)
CSN: [id number]     Arrival date & time 01/29/15  2334 History   First MD Initiated Contact with Patient 01/30/15 0015     Chief Complaint  Patient presents with  . Weakness     (Consider location/radiation/quality/duration/timing/severity/associated sxs/prior Treatment) HPI  This is a 54 year old female with a history of hyperlipidemia and vertigo who presents with several complaints. Patient reports that she was at 2 weeks ago and was exposed to mold. Since that time she has had intermittent dizzy spells. She has a history of vertigo and feels that this is similar. She describes worsening dizziness with head movement. Denies room spinning dizziness but more lightheadedness. She states that one hour prior to arrival she got very "shaky." During that time she developed left-sided chest pain that was sharp and nonradiating which lasted for less than a minute. It was nonexertional. She states that she felt very anxious and weak. She denies any fevers or cough. She denies any vomiting but does endorse nausea. She denies any focal weakness or numbness. She is a nonsmoker. No early family history of heart disease. No personal history of hypertension or diabetes.  Patient now states that she feels at her baseline. She is questioning whether or not she should have come. She states that she almost "turned around."  Past Medical History  Diagnosis Date  . Hyperlipidemia   . 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 Thyroid disease 2008    HYPOTHYROIDISM  . Pollen allergies   . Allergy    Past Surgical History  Procedure Laterality Date  . Cesarean section  1992  . Popliteal synovial cyst excision Left 1974   Family History  Problem Relation Age of Onset  . Prostate cancer    . Cancer Father 91    prostate  . Diabetes Daughter   . Celiac disease Daughter   . Anuerysm Maternal Grandfather   . Diabetes Daughter   . Colon cancer Neg Hx    Social History  Substance Use Topics  . Smoking status: Never Smoker    . Smokeless tobacco: Never Used  . Alcohol Use: No   OB History    Gravida Para Term Preterm AB TAB SAB Ectopic Multiple Living   2 2        2      Review of Systems  Constitutional: Negative for fever.  Respiratory: Negative for cough, chest tightness and shortness of breath.   Cardiovascular: Positive for chest pain.  Gastrointestinal: Positive for nausea. Negative for vomiting and abdominal pain.  Genitourinary: Negative for dysuria.  Neurological: Positive for dizziness, weakness and light-headedness. Negative for headaches.  Psychiatric/Behavioral: Negative for confusion. The patient is nervous/anxious.   All other systems reviewed and are negative.      Allergies  Other  Home Medications   Prior to Admission medications   Medication Sig Start Date End Date Taking? Authorizing Provider  cetirizine (ZYRTEC) 10 MG tablet Take 10 mg by mouth daily.    Historical Provider, MD  Cholecalciferol (VITAMIN D) 1000 UNITS capsule Take 1,000 Units by mouth daily.      Historical Provider, MD  Cyanocobalamin (B-12) 2500 MCG TABS Take 1 tablet by mouth daily.    Historical Provider, MD  levothyroxine (SYNTHROID, LEVOTHROID) 75 MCG tablet TAKE ONE TABLET BY MOUTH ONCE DAILY BEFORE BREAKFAST 12/30/14   Debbrah Alar, NP  meclizine (ANTIVERT) 25 MG tablet Take 1 tablet (25 mg total) by mouth 3 (three) times daily as needed for dizziness. 01/30/15   Merryl Hacker, MD  Multiple Vitamin (MULTIVITAMIN) tablet Take 1 tablet by mouth daily.      Historical Provider, MD   BP 102/67 mmHg  Pulse 64  Temp(Src) 98.1 F (36.7 C) (Oral)  Resp 16  Ht 5\' 1"  (1.549 m)  Wt 130 lb (58.968 kg)  BMI 24.58 kg/m2  SpO2 97%  LMP 07/02/2011 Physical Exam  Constitutional: She is oriented to person, place, and time. She appears well-developed and well-nourished. No distress.  HENT:  Head: Normocephalic and atraumatic.  Mouth/Throat: Oropharynx is clear and moist.  Eyes: EOM are normal. Pupils are  equal, round, and reactive to light.  Neck: Neck supple.  Cardiovascular: Normal rate, regular rhythm and normal heart sounds.   No murmur heard. Pulmonary/Chest: Effort normal and breath sounds normal. No respiratory distress. She has no wheezes.  Abdominal: Soft. Bowel sounds are normal. There is no tenderness. There is no rebound.  Musculoskeletal: She exhibits no edema.  Neurological: She is alert and oriented to person, place, and time.  No dysmetria to finger-nose-finger, normal gait  Skin: Skin is warm and dry.  Psychiatric: She has a normal mood and affect.  Nursing note and vitals reviewed.   ED Course  Procedures (including critical care time) Labs Review Labs Reviewed  BASIC METABOLIC PANEL - Abnormal; Notable for the following:    Chloride 100 (*)    Glucose, Bld 133 (*)    BUN 21 (*)    All other components within normal limits  CBG MONITORING, ED - Abnormal; Notable for the following:    Glucose-Capillary 130 (*)    All other components within normal limits  CBC WITH DIFFERENTIAL/PLATELET  TROPONIN I    Imaging Review Dg Chest 2 View  01/30/2015   CLINICAL DATA:  Dizziness. Light internal tremors. Symphysis for 1 day. Some chest discomfort. Nonsmoker.  EXAM: CHEST  2 VIEW  COMPARISON:  None.  FINDINGS: The heart size and mediastinal contours are within normal limits. Both lungs are clear. The visualized skeletal structures are unremarkable.  IMPRESSION: No active cardiopulmonary disease.   Electronically Signed   By: Lucienne Capers M.D.   On: 01/30/2015 01:57   I have personally reviewed and evaluated these images and lab results as part of my medical decision-making.   EKG Interpretation   Date/Time:  Thursday January 30 2015 01:23:02 EDT Ventricular Rate:  67 PR Interval:  216 QRS Duration: 82 QT Interval:  418 QTC Calculation: 441 R Axis:   48 Text Interpretation:  Sinus rhythm with 1st degree A-V block Otherwise  normal ECG Confirmed by Jaeven Wanzer  MD,  Loma Sousa ([id number]) on 01/30/2015 4:50:57  AM      MDM   Final diagnoses:  Dizziness  Chest pain, unspecified chest pain type    This is a 54 year old female who presents with generalized weakness, chest pain, anxiety, and lightheadedness. Nontoxic on exam. Initially patient was questioning whether or not any workup needed to be done. I implored her to stay sac get a screening EKG and some basic lab work. She has agreed. Vital signs are reassuring. She is nonfocal on exam. She does have a history of vertigo. Will give patient meclizine given her dizziness.  Lab work obtained and largely reassuring. EKG is nonischemic and troponin is negative.  Patient is low risk for ACS with her only risk factor of hyperlipidemia and heart score of 1. Chest x-ray is also reassuring.  Doubt PE.  Patient is very low risk and age is the only thing that prevents her from  meeting PERC criteria discussed workup with the patient. She reports improvement of dizziness with meclizine. Will discharge home with meclizine. Patient to follow-up with her primary physician for recheck of today's symptoms.  She was given strict return precautions.  After history, exam, and medical workup I feel the patient has been appropriately medically screened and is safe for discharge home. Pertinent diagnoses were discussed with the patient. Patient was given return precautions.      Merryl Hacker, MD 01/30/15 [phone]

## 2015-01-30 NOTE — Progress Notes (Signed)
Pre visit review using our clinic review tool, if applicable. No additional management support is needed unless otherwise documented below in the visit note. 

## 2015-02-02 DIAGNOSIS — H8110 Benign paroxysmal vertigo, unspecified ear: Secondary | ICD-10-CM | POA: Insufficient documentation

## 2015-02-02 NOTE — Assessment & Plan Note (Signed)
?   Allergies Flonase, antihistamine meclazine prn rto prn

## 2015-02-21 ENCOUNTER — Encounter: Payer: Self-pay | Admitting: Women's Health

## 2015-02-21 ENCOUNTER — Other Ambulatory Visit (HOSPITAL_COMMUNITY)
Admission: RE | Admit: 2015-02-21 | Discharge: 2015-02-21 | Disposition: A | Discharge status: Home / Self Care | Payer: BLUE CROSS/BLUE SHIELD | Source: Ambulatory Visit | Attending: Women's Health | Admitting: Women's Health

## 2015-02-21 ENCOUNTER — Ambulatory Visit (INDEPENDENT_AMBULATORY_CARE_PROVIDER_SITE_OTHER): Payer: BLUE CROSS/BLUE SHIELD | Admitting: Women's Health

## 2015-02-21 VITALS — BP 118/78 | Ht 61.0 in | Wt 133.0 lb

## 2015-02-21 DIAGNOSIS — R7309 Other abnormal glucose: Secondary | ICD-10-CM | POA: Diagnosis not present

## 2015-02-21 DIAGNOSIS — Z01419 Encounter for gynecological examination (general) (routine) without abnormal findings: Secondary | ICD-10-CM

## 2015-02-21 DIAGNOSIS — Z1151 Encounter for screening for human papillomavirus (HPV): Secondary | ICD-10-CM | POA: Insufficient documentation

## 2015-02-21 DIAGNOSIS — R739 Hyperglycemia, unspecified: Secondary | ICD-10-CM

## 2015-02-21 LAB — HEMOGLOBIN A1C
Hgb A1c MFr Bld: 5.9 % — ABNORMAL HIGH (ref <5.7)
Mean Plasma Glucose: 123 mg/dL — ABNORMAL HIGH (ref <117)

## 2015-02-21 NOTE — Patient Instructions (Signed)
Health Recommendations for Postmenopausal Women Respected and ongoing research has looked at the most common causes of death, disability, and poor quality of life in postmenopausal women. The causes include heart disease, diseases of blood vessels, diabetes, depression, cancer, and bone loss (osteoporosis). Many things can be done to help lower the chances of developing these and other common problems. CARDIOVASCULAR DISEASE Heart Disease: A heart attack is a medical emergency. Know the signs and symptoms of a heart attack. Below are things women can do to reduce their risk for heart disease.   Do not smoke. If you smoke, quit.  Aim for a healthy weight. Being overweight causes many preventable deaths. Eat a healthy and balanced diet and drink an adequate amount of liquids.  Get moving. Make a commitment to be more physically active. Aim for 30 minutes of activity on most, if not all days of the week.  Eat for heart health. Choose a diet that is low in saturated fat and cholesterol and eliminate trans fat. Include whole grains, vegetables, and fruits. Read and understand the labels on food containers before buying.  Know your numbers. Ask your caregiver to check your blood pressure, cholesterol (total, HDL, LDL, triglycerides) and blood glucose. Work with your caregiver on improving your entire clinical picture.  High blood pressure. Limit or stop your table salt intake (try salt substitute and food seasonings). Avoid salty foods and drinks. Read labels on food containers before buying. Eating well and exercising can help control high blood pressure. STROKE  Stroke is a medical emergency. Stroke may be the result of a blood clot in a blood vessel in the brain or by a brain hemorrhage (bleeding). Know the signs and symptoms of a stroke. To lower the risk of developing a stroke:  Avoid fatty foods.  Quit smoking.  Control your diabetes, blood pressure, and irregular heart rate. THROMBOPHLEBITIS  (BLOOD CLOT) OF THE LEG  Becoming overweight and leading a stationary lifestyle may also contribute to developing blood clots. Controlling your diet and exercising will help lower the risk of developing blood clots. CANCER SCREENING  Breast Cancer: Take steps to reduce your risk of breast cancer.  You should practice "breast self-awareness." This means understanding the normal appearance and feel of your breasts and should include breast self-examination. Any changes detected, no matter how small, should be reported to your caregiver.  After age 40, you should have a clinical breast exam (CBE) every year.  Starting at age 40, you should consider having a mammogram (breast X-ray) every year.  If you have a family history of breast cancer, talk to your caregiver about genetic screening.  If you are at high risk for breast cancer, talk to your caregiver about having an MRI and a mammogram every year.  Intestinal or Stomach Cancer: Tests to consider are a rectal exam, fecal occult blood, sigmoidoscopy, and colonoscopy. Women who are high risk may need to be screened at an earlier age and more often.  Cervical Cancer:  Beginning at age 30, you should have a Pap test every 3 years as long as the past 3 Pap tests have been normal.  If you have had past treatment for cervical cancer or a condition that could lead to cancer, you need Pap tests and screening for cancer for at least 20 years after your treatment.  If you had a hysterectomy for a problem that was not cancer or a condition that could lead to cancer, then you no longer need Pap tests.    If you are between ages 65 and 70, and you have had normal Pap tests going back 10 years, you no longer need Pap tests.  If Pap tests have been discontinued, risk factors (such as a new sexual partner) need to be reassessed to determine if screening should be resumed.  Some medical problems can increase the chance of getting cervical cancer. In these  cases, your caregiver may recommend more frequent screening and Pap tests.  Uterine Cancer: If you have vaginal bleeding after reaching menopause, you should notify your caregiver.  Ovarian Cancer: Other than yearly pelvic exams, there are no reliable tests available to screen for ovarian cancer at this time except for yearly pelvic exams.  Lung Cancer: Yearly chest X-rays can detect lung cancer and should be done on high risk women, such as cigarette smokers and women with chronic lung disease (emphysema).  Skin Cancer: A complete body skin exam should be done at your yearly examination. Avoid overexposure to the sun and ultraviolet light lamps. Use a strong sun block cream when in the sun. All of these things are important for lowering the risk of skin cancer. MENOPAUSE Menopause Symptoms: Hormone therapy products are effective for treating symptoms associated with menopause:  Moderate to severe hot flashes.  Night sweats.  Mood swings.  Headaches.  Tiredness.  Loss of sex drive.  Insomnia.  Other symptoms. Hormone replacement carries certain risks, especially in older women. Women who use or are thinking about using estrogen or estrogen with progestin treatments should discuss that with their caregiver. Your caregiver will help you understand the benefits and risks. The ideal dose of hormone replacement therapy is not known. The Food and Drug Administration (FDA) has concluded that hormone therapy should be used only at the lowest doses and for the shortest amount of time to reach treatment goals.  OSTEOPOROSIS Protecting Against Bone Loss and Preventing Fracture If you use hormone therapy for prevention of bone loss (osteoporosis), the risks for bone loss must outweigh the risk of the therapy. Ask your caregiver about other medications known to be safe and effective for preventing bone loss and fractures. To guard against bone loss or fractures, the following is recommended:  If  you are younger than age 50, take 1000 mg of calcium and at least 600 mg of Vitamin D per day.  If you are older than age 50 but younger than age 70, take 1200 mg of calcium and at least 600 mg of Vitamin D per day.  If you are older than age 70, take 1200 mg of calcium and at least 800 mg of Vitamin D per day. Smoking and excessive alcohol intake increases the risk of osteoporosis. Eat foods rich in calcium and vitamin D and do weight bearing exercises several times a week as your caregiver suggests. DIABETES Diabetes Mellitus: If you have type I or type 2 diabetes, you should keep your blood sugar under control with diet, exercise, and recommended medication. Avoid starchy and fatty foods, and too many sweets. Being overweight can make diabetes control more difficult. COGNITION AND MEMORY Cognition and Memory: Menopausal hormone therapy is not recommended for the prevention of cognitive disorders such as Alzheimer's disease or memory loss.  DEPRESSION  Depression may occur at any age, but it is common in elderly women. This may be because of physical, medical, social (loneliness), or financial problems and needs. If you are experiencing depression because of medical problems and control of symptoms, talk to your caregiver about this. Physical   activity and exercise may help with mood and sleep. Community and volunteer involvement may improve your sense of value and worth. If you have depression and you feel that the problem is getting worse or becoming severe, talk to your caregiver about which treatment options are best for you. ACCIDENTS  Accidents are common and can be serious in elderly woman. Prepare your house to prevent accidents. Eliminate throw rugs, place hand bars in bath, shower, and toilet areas. Avoid wearing high heeled shoes or walking on wet, snowy, and icy areas. Limit or stop driving if you have vision or hearing problems, or if you feel you are unsteady with your movements and  reflexes. HEPATITIS C Hepatitis C is a type of viral infection affecting the liver. It is spread mainly through contact with blood from an infected person. It can be treated, but if left untreated, it can lead to severe liver damage over the years. Many people who are infected do not know that the virus is in their blood. If you are a "baby-boomer", it is recommended that you have one screening test for Hepatitis C. IMMUNIZATIONS  Several immunizations are important to consider having during your senior years, including:   Tetanus, diphtheria, and pertussis booster shot.  Influenza every year before the flu season begins.  Pneumonia vaccine.  Shingles vaccine.  Others, as indicated based on your specific needs. Talk to your caregiver about these. Document Released: 07/16/2005 Document Revised: 10/08/2013 Document Reviewed: 03/11/2008 ExitCare Patient Information 2015 [employer]. This information is not intended to replace advice given to you by your health care provider. Make sure you discuss any questions you have with your health care provider.  

## 2015-02-21 NOTE — Addendum Note (Signed)
Addended by: Burnett Kanaris on: 02/21/2015 10:19 AM   Modules accepted: Orders

## 2015-02-21 NOTE — Progress Notes (Signed)
Stacey [name] 1960/09/14 [id number]    History:    Presents for annual exam.  Is menopausal/no bleeding/no HRT. Normal Pap and mammogram history. Hypothyroid primary care manages. 01/30/2015 ER visit for heart palpitations, questionable panic attack, dizziness. All tests normal, glucose 133. Symptoms have resolved. 2015 negative colonoscopy.  Past medical history, past surgical history, family history and social history were all reviewed and documented in the EPIC chart. 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  ROS:  A ROS was performed and pertinent positives and negatives are included.  Exam:  Filed Vitals:   02/21/15 0926  BP: 118/78    General appearance:  Normal Thyroid:  Symmetrical, normal in size, without palpable masses or nodularity. Respiratory  Auscultation:  Clear without wheezing or rhonchi Cardiovascular  Auscultation:  Regular rate, without rubs, murmurs or gallops  Edema/varicosities:  Not grossly evident Abdominal  Soft,nontender, without masses, guarding or rebound.  Liver/spleen:  No organomegaly noted  Hernia:  None appreciated  Skin  Inspection:  Grossly normal   Breasts: Examined lying and sitting.     Right: Without masses, retractions, discharge or axillary adenopathy.     Left: Without masses, retractions, discharge or axillary adenopathy. Gentitourinary   Inguinal/mons:  Normal without inguinal adenopathy  External genitalia:  Normal  BUS/Urethra/Skene's glands:  Normal  Vagina:  Normal  Cervix:  Normal  Uterus:   normal in size, shape and contour.  Midline and mobile  Adnexa/parametria:     Rt: Without masses or tenderness.   Lt: Without masses or tenderness.  Anus and perineum: Normal  Digital rectal exam: Normal sphincter tone without palpated masses or tenderness  Assessment/Plan:  54 y.o. MWF G2  P2 for annual exam with no complaints.  Postmenopausal/no HRT/no bleeding Hypothyroid-primary care manages labs and meds Elevated glucose 01/2015  Plan: We'll check hemoglobin A1c. Continue healthy lifestyle of diet and exercise. SBE's, annual screening mammogram 3-D tomography reviewed and encouraged history of dense breasts. Vitamin D 1000 daily encouraged. UA, Pap with HR HPV typing, new screening guidelines reviewed.    Huel Cote WHNP, 10:09 AM 02/21/2015

## 2015-02-22 LAB — URINALYSIS W MICROSCOPIC + REFLEX CULTURE
Bacteria, UA: NONE SEEN [HPF]
Bilirubin Urine: NEGATIVE
Casts: NONE SEEN [LPF]
Crystals: NONE SEEN [HPF]
Glucose, UA: NEGATIVE
Hgb urine dipstick: NEGATIVE
Ketones, ur: NEGATIVE
Leukocytes, UA: NEGATIVE
Nitrite: NEGATIVE
Protein, ur: NEGATIVE
Specific Gravity, Urine: 1.022 (ref 1.001–1.035)
WBC, UA: NONE SEEN WBC/HPF (ref <=5)
Yeast: NONE SEEN [HPF]
pH: 6 (ref 5.0–8.0)

## 2015-02-24 ENCOUNTER — Other Ambulatory Visit: Payer: Self-pay | Admitting: Women's Health

## 2015-02-24 DIAGNOSIS — R7309 Other abnormal glucose: Secondary | ICD-10-CM

## 2015-02-24 LAB — CYTOLOGY - PAP

## 2015-02-24 LAB — URINE CULTURE: Colony Count: >=100000

## 2015-02-24 MED ORDER — CIPROFLOXACIN HCL 250 MG PO TABS: 250.0000 mg | ORAL_TABLET | Freq: Two times a day (BID) | ORAL | Status: DC

## 2015-02-27 ENCOUNTER — Telehealth: Payer: Self-pay | Admitting: *Deleted

## 2015-02-27 MED ORDER — NITROFURANTOIN MONOHYD MACRO 100 MG PO CAPS: 100.0000 mg | ORAL_CAPSULE | Freq: Two times a day (BID) | ORAL | Status: DC

## 2015-02-27 NOTE — Telephone Encounter (Signed)
Okay, Macrobid 100 mg by mouth twice a day for 7 days #14, she has 2 organisms in urine, Macrobid will be effective for both.

## 2015-02-27 NOTE — Telephone Encounter (Signed)
Pt informed she has UTI prescribed Cipro 250 mg #6 twice weekly. Pt said after reading side effects she would like to have other medication that is not so "strong", I explained to patient that all medication have side effects, she understood, but still asked me to check with you if other medication could be prescribed? Please advise

## 2015-02-27 NOTE — Telephone Encounter (Signed)
Pt informed, Rx sent. 

## 2015-03-02 ENCOUNTER — Other Ambulatory Visit: Payer: Self-pay | Admitting: Family Medicine

## 2015-04-23 ENCOUNTER — Ambulatory Visit (INDEPENDENT_AMBULATORY_CARE_PROVIDER_SITE_OTHER): Payer: BLUE CROSS/BLUE SHIELD | Admitting: Physician Assistant

## 2015-04-23 ENCOUNTER — Encounter: Payer: Self-pay | Admitting: Physician Assistant

## 2015-04-23 ENCOUNTER — Other Ambulatory Visit: Payer: Self-pay | Admitting: Family Medicine

## 2015-04-23 VITALS — BP 112/71 | HR 91 | Temp 98.0°F | Resp 16 | Ht 61.0 in | Wt 133.5 lb

## 2015-04-23 DIAGNOSIS — J019 Acute sinusitis, unspecified: Secondary | ICD-10-CM

## 2015-04-23 DIAGNOSIS — B9689 Other specified bacterial agents as the cause of diseases classified elsewhere: Secondary | ICD-10-CM | POA: Insufficient documentation

## 2015-04-23 MED ORDER — AMOXICILLIN-POT CLAVULANATE 875-125 MG PO TABS: 1.0000 | ORAL_TABLET | Freq: Two times a day (BID) | ORAL | Status: DC

## 2015-04-23 MED ORDER — MECLIZINE HCL 25 MG PO TABS: ORAL_TABLET | ORAL | Status: DC

## 2015-04-23 NOTE — Assessment & Plan Note (Signed)
Rx Augmentin.  Increase fluids.  Rest.  Saline nasal spray.  Probiotic.  Mucinex as directed.  Humidifier in bedroom. Continue allergy medications.  Call or return to clinic if symptoms are not improving.  

## 2015-04-23 NOTE — Patient Instructions (Signed)
Please take antibiotic as directed.  Increase fluid intake.  Use Saline nasal spray.  Take a daily multivitamin. Continue allergy medications.  Place a humidifier in the bedroom.  Please call or return clinic if symptoms are not improving.  Sinusitis Sinusitis is redness, soreness, and swelling (inflammation) of the paranasal sinuses. Paranasal sinuses are air pockets within the bones of your face (beneath the eyes, the middle of the forehead, or above the eyes). In healthy paranasal sinuses, mucus is able to drain out, and air is able to circulate through them by way of your nose. However, when your paranasal sinuses are inflamed, mucus and air can become trapped. This can allow bacteria and other germs to grow and cause infection. Sinusitis can develop quickly and last only a short time (acute) or continue over a long period (chronic). Sinusitis that lasts for more than 12 weeks is considered chronic.  CAUSES  Causes of sinusitis include:  Allergies.  Structural abnormalities, such as displacement of the cartilage that separates your nostrils (deviated septum), which can decrease the air flow through your nose and sinuses and affect sinus drainage.  Functional abnormalities, such as when the small hairs (cilia) that line your sinuses and help remove mucus do not work properly or are not present. SYMPTOMS  Symptoms of acute and chronic sinusitis are the same. The primary symptoms are pain and pressure around the affected sinuses. Other symptoms include:  Upper toothache.  Earache.  Headache.  Bad breath.  Decreased sense of smell and taste.  A cough, which worsens when you are lying flat.  Fatigue.  Fever.  Thick drainage from your nose, which often is green and may contain pus (purulent).  Swelling and warmth over the affected sinuses. DIAGNOSIS  Your caregiver will perform a physical exam. During the exam, your caregiver may:  Look in your nose for signs of abnormal growths  in your nostrils (nasal polyps).  Tap over the affected sinus to check for signs of infection.  View the inside of your sinuses (endoscopy) with a special imaging device with a light attached (endoscope), which is inserted into your sinuses. If your caregiver suspects that you have chronic sinusitis, one or more of the following tests may be recommended:  Allergy tests.  Nasal culture A sample of mucus is taken from your nose and sent to a lab and screened for bacteria.  Nasal cytology A sample of mucus is taken from your nose and examined by your caregiver to determine if your sinusitis is related to an allergy. TREATMENT  Most cases of acute sinusitis are related to a viral infection and will resolve on their own within 10 days. Sometimes medicines are prescribed to help relieve symptoms (pain medicine, decongestants, nasal steroid sprays, or saline sprays).  However, for sinusitis related to a bacterial infection, your caregiver will prescribe antibiotic medicines. These are medicines that will help kill the bacteria causing the infection.  Rarely, sinusitis is caused by a fungal infection. In theses cases, your caregiver will prescribe antifungal medicine. For some cases of chronic sinusitis, surgery is needed. Generally, these are cases in which sinusitis recurs more than 3 times per year, despite other treatments. HOME CARE INSTRUCTIONS   Drink plenty of water. Water helps thin the mucus so your sinuses can drain more easily.  Use a humidifier.  Inhale steam 3 to 4 times a day (for example, sit in the bathroom with the shower running).  Apply a warm, moist washcloth to your face 3 to 4   times a day, or as directed by your caregiver.  Use saline nasal sprays to help moisten and clean your sinuses.  Take over-the-counter or prescription medicines for pain, discomfort, or fever only as directed by your caregiver. SEEK IMMEDIATE MEDICAL CARE IF:  You have increasing pain or severe  headaches.  You have nausea, vomiting, or drowsiness.  You have swelling around your face.  You have vision problems.  You have a stiff neck.  You have difficulty breathing. MAKE SURE YOU:   Understand these instructions.  Will watch your condition.  Will get help right away if you are not doing well or get worse. Document Released: 05/24/2005 Document Revised: 08/16/2011 Document Reviewed: 06/08/2011 ExitCare Patient Information 2014 [employer].   

## 2015-04-23 NOTE — Progress Notes (Signed)
Pre visit review using our clinic review tool, if applicable. No additional management support is needed unless otherwise documented below in the visit note/SLS  

## 2015-04-23 NOTE — Progress Notes (Signed)
History of Present Illness: Stacey [name] is a 54 y.o. female who present to the clinic today complaining of sinus pressure, sinus pain and nasal congestion. Patient endorses symptoms have been present for 6 days but have acutely worsened over past 24 hours.  Endorses L ear pain, facial pain and fatigue.  Patient denies chest pain, SOB or wheezing. Has been taking Alka Seltzer for symptoms relief..   History: Past Medical History  Diagnosis Date  . Hyperlipidemia   . 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 Thyroid disease 2008    HYPOTHYROIDISM  . Pollen allergies     Current outpatient prescriptions:  .  amoxicillin-clavulanate (AUGMENTIN) 875-125 MG tablet, Take 1 tablet by mouth 2 (two) times daily., Disp: 14 tablet, Rfl: 0 .  cetirizine (ZYRTEC) 10 MG tablet, Take 10 mg by mouth daily., Disp: , Rfl:  .  Cholecalciferol (VITAMIN D) 1000 UNITS capsule, Take 1,000 Units by mouth daily.  , Disp: , Rfl:  .  Cyanocobalamin (B-12) 2500 MCG TABS, Take 1 tablet by mouth daily., Disp: , Rfl:  .  fluticasone (FLONASE) 50 MCG/ACT nasal spray, Place 2 sprays into both nostrils daily., Disp: 16 g, Rfl: 6 .  levothyroxine (SYNTHROID, LEVOTHROID) 75 MCG tablet, TAKE ONE TABLET BY MOUTH ONCE DAILY BEFORE BREAKFAST, Disp: 30 tablet, Rfl: 5 .  meclizine (ANTIVERT) 25 MG tablet, TAKE ONE TABLET BY MOUTH THREE TIMES DAILY AS NEEDED FOR DIZZINESS, Disp: 30 tablet, Rfl: 0 .  Multiple Vitamin (MULTIVITAMIN) tablet, Take 1 tablet by mouth daily.  , Disp: , Rfl:  Allergies  Allergen Reactions  . Other     TREES, POLLEN, DUST, MOLD AND ANIMALS   Family History  Problem Relation Age of Onset  . Prostate cancer    . Cancer Father 48    prostate  . Diabetes Daughter   . Celiac disease Daughter   . Anuerysm Maternal Grandfather   . Diabetes Daughter   . Colon cancer Neg Hx    Social History   Social History  . Marital Status: Married    Spouse Name: N/A  . Number of Children: 2  . Years of Education: N/A    Occupational History  . Training and development officer    Social History Main Topics  . Smoking status: Never Smoker   . Smokeless tobacco: Never Used  . Alcohol Use: No  . Drug Use: No  . Sexual Activity:    Partners: Male    Birth Control/ Protection: Condom   Other Topics Concern  . None   Social History Narrative    Review of Systems: See HPI.  All other ROS are negative.  Physical Examination: BP 112/71 mmHg  Pulse 91  Temp(Src) 98 F (36.7 C) (Oral)  Resp 16  Ht 5\' 1"  (1.549 m)  Wt 133 lb 8 oz (60.555 kg)  BMI 25.24 kg/m2  SpO2 99%  LMP 07/02/2011  General appearance: alert, cooperative and appears stated age Head: Normocephalic, without obvious abnormality, atraumatic, sinuses tender to percussion Eyes: conjunctivae/corneas clear. PERRL, EOM's intact. Fundi benign. Ears: normal TM's and external ear canals both ears Nose: moderate congestion, turbinates swollen, sinus tenderness bilateral Throat: lips, mucosa, and tongue normal; teeth and gums normal Neck: no adenopathy, no carotid bruit, no JVD, supple, symmetrical, trachea midline and thyroid not enlarged, symmetric, no tenderness/mass/nodules Lungs: clear to auscultation bilaterally Chest wall: no tenderness  Assessment/Plan: Acute bacterial sinusitis Rx Augmentin.  Increase fluids.  Rest.  Saline nasal spray.  Probiotic.  Mucinex as directed.  Humidifier in  bedroom. Continue allergy medications.  Call or return to clinic if symptoms are not improving.

## 2015-04-30 ENCOUNTER — Telehealth: Payer: Self-pay | Admitting: Family Medicine

## 2015-04-30 DIAGNOSIS — J019 Acute sinusitis, unspecified: Principal | ICD-10-CM

## 2015-04-30 DIAGNOSIS — B9689 Other specified bacterial agents as the cause of diseases classified elsewhere: Secondary | ICD-10-CM

## 2015-04-30 MED ORDER — AMOXICILLIN-POT CLAVULANATE 875-125 MG PO TABS: 1.0000 | ORAL_TABLET | Freq: Two times a day (BID) | ORAL | Status: DC

## 2015-04-30 NOTE — Telephone Encounter (Signed)
Caller name: Self   Can be reached: [phone]  Pharmacy:  [facility] [address] ([address]), [place] - [address] [phone] (Phone) [phone] (Fax)         Reason for call: Patient requesting a refill on amoxicillin-clavulanate (AUGMENTIN) 875-125 MG tablet [id number] States she has completed what was given and still have Upper Resp issues

## 2015-04-30 NOTE — Telephone Encounter (Signed)
Would have finished medication today. Stay well hydrated. Flonase daily. Ok to extend Augmentin for 3 more days to complete a 10-day course.

## 2015-04-30 NOTE — Telephone Encounter (Signed)
Pt notified and made aware.  Rx sent to preferred pharmacy.  No further needs voiced.

## 2015-04-30 NOTE — Telephone Encounter (Signed)
Pt is on vacation and continues to experience nasal congestion and slight headache.  She has completed antibiotics and would like a refill.  Denies cough, fever, chills.    Last OV: 04/23/15  Please advise.

## 2015-05-28 ENCOUNTER — Telehealth: Payer: Self-pay | Admitting: Medical

## 2015-05-28 ENCOUNTER — Ambulatory Visit: Payer: BLUE CROSS/BLUE SHIELD | Admitting: Medical

## 2015-05-28 ENCOUNTER — Ambulatory Visit (HOSPITAL_BASED_OUTPATIENT_CLINIC_OR_DEPARTMENT_OTHER)
Admission: RE | Admit: 2015-05-28 | Discharge: 2015-05-28 | Disposition: A | Discharge status: Home / Self Care | Payer: BLUE CROSS/BLUE SHIELD | Source: Ambulatory Visit | Attending: Medical | Admitting: Medical

## 2015-05-28 ENCOUNTER — Encounter: Payer: Self-pay | Admitting: Medical

## 2015-05-28 ENCOUNTER — Ambulatory Visit (INDEPENDENT_AMBULATORY_CARE_PROVIDER_SITE_OTHER): Payer: BLUE CROSS/BLUE SHIELD | Admitting: Medical

## 2015-05-28 VITALS — BP 110/70 | HR 81 | Temp 97.8°F | Ht 61.0 in | Wt 135.8 lb

## 2015-05-28 DIAGNOSIS — R229 Localized swelling, mass and lump, unspecified: Secondary | ICD-10-CM

## 2015-05-28 DIAGNOSIS — M898X8 Other specified disorders of bone, other site: Secondary | ICD-10-CM | POA: Insufficient documentation

## 2015-05-28 DIAGNOSIS — IMO0002 Reserved for concepts with insufficient information to code with codable children: Secondary | ICD-10-CM

## 2015-05-28 DIAGNOSIS — M89319 Hypertrophy of bone, unspecified shoulder: Secondary | ICD-10-CM

## 2015-05-28 NOTE — Progress Notes (Signed)
Pre visit review using our clinic review tool, if applicable. No additional management support is needed unless otherwise documented below in the visit note. 

## 2015-05-28 NOTE — Patient Instructions (Addendum)
Will get xray clavicle/sternum junction today. Further plan and management to be determined based on findings.   If any pain or new signs or symptoms let us know.  Follow up 10-14 days or as needed

## 2015-05-28 NOTE — Telephone Encounter (Signed)
Talked with ultrasonographer. She states she will change order as needed. Pt needs to have ultrasound of rt clavicle/sterunum junction. Will you schedule that.

## 2015-05-28 NOTE — Progress Notes (Signed)
   Subjective:    Patient ID: Stacey [name], female    DOB: April 14, 1961, 54 y.o.   MRN: [id number]  HPI  Pt states she noticed a lump on her rt clavicle region. She just noticed today. No pain. No fever, no cough, and no chills. Pt denies any remote injury to clavicle or sternum.  3 months ago cbc was normal. Normal white count. Bmp normal 3 months ago. 5 month ago normal alk phospatase.   Review of Systems  Constitutional: Negative for fever, chills and fatigue.  Respiratory: Negative for cough, chest tightness, shortness of breath and wheezing.   Cardiovascular: Negative for chest pain and palpitations.  Musculoskeletal: Negative for back pain.       Rt clavile region lump.  Skin: Negative for rash.  Neurological: Negative for dizziness.  Hematological: Negative for adenopathy. Does not bruise/bleed easily.  Psychiatric/Behavioral: Negative for behavioral problems and confusion.       Objective:   Physical Exam  General  Mental Status - Alert. General Appearance - Well groomed. Not in acute distress.  Skin Rashes- No Rashes.    Neck Neck- Supple. No Masses.   Chest and Lung Exam Auscultation: Breath Sounds:- even and unlabored,  Cardiovascular Auscultation:Rythm- Regular, rate and rhythm. Murmurs & Other Heart Sounds:Ausculatation of the heart reveal- No Murmurs.  Lymphatic Head & Neck General Head & Neck Lymphatics: Bilateral: Description- No Localized lymphadenopathy. Particularly no abnormal lymph nodes felt in area of neck or clavicle area on either side.  Skeletal exam- both clavicle not tender. Rt side at sternoclavicular junction has moderate prominent protuberance but not tender. On rom on upper ext no clavicle pain.       Assessment & Plan:  Will get xray clavicle/sternum junction today. Further plan and management to be determined based on findings.   If any pain or new signs or symptoms let us know.  Follow up 10-14 days or as needed  After  xrays came back. Did not show area of concern well. So radiologist recommended [place]. I placed that order today.

## 2015-05-29 ENCOUNTER — Ambulatory Visit (HOSPITAL_BASED_OUTPATIENT_CLINIC_OR_DEPARTMENT_OTHER)
Admission: RE | Admit: 2015-05-29 | Discharge: 2015-05-29 | Disposition: A | Discharge status: Home / Self Care | Payer: BLUE CROSS/BLUE SHIELD | Source: Ambulatory Visit | Attending: Medical | Admitting: Medical

## 2015-05-29 ENCOUNTER — Other Ambulatory Visit: Payer: Self-pay | Admitting: Medical

## 2015-05-29 ENCOUNTER — Ambulatory Visit: Payer: BLUE CROSS/BLUE SHIELD | Admitting: Family Medicine

## 2015-05-29 DIAGNOSIS — M89319 Hypertrophy of bone, unspecified shoulder: Secondary | ICD-10-CM

## 2015-05-29 DIAGNOSIS — M898X8 Other specified disorders of bone, other site: Secondary | ICD-10-CM | POA: Insufficient documentation

## 2015-05-29 DIAGNOSIS — R229 Localized swelling, mass and lump, unspecified: Secondary | ICD-10-CM | POA: Insufficient documentation

## 2015-05-29 DIAGNOSIS — IMO0002 Reserved for concepts with insufficient information to code with codable children: Secondary | ICD-10-CM

## 2015-05-29 DIAGNOSIS — M2540 Effusion, unspecified joint: Secondary | ICD-10-CM

## 2015-05-29 NOTE — Telephone Encounter (Signed)
I did go ahead and make referral to sports medicine. Let me know if pt does not want to go.

## 2015-05-29 NOTE — Telephone Encounter (Signed)
Referral faxed to Imaging/awaiting appt

## 2015-06-03 LAB — HM MAMMOGRAPHY: HM Mammogram: NEGATIVE

## 2015-06-04 ENCOUNTER — Ambulatory Visit (INDEPENDENT_AMBULATORY_CARE_PROVIDER_SITE_OTHER): Payer: BLUE CROSS/BLUE SHIELD | Admitting: Family Medicine

## 2015-06-04 ENCOUNTER — Encounter: Payer: Self-pay | Admitting: Family Medicine

## 2015-06-04 VITALS — BP 110/78 | HR 73 | Temp 98.4°F | Ht 61.0 in | Wt 138.0 lb

## 2015-06-04 DIAGNOSIS — B9689 Other specified bacterial agents as the cause of diseases classified elsewhere: Secondary | ICD-10-CM

## 2015-06-04 DIAGNOSIS — J019 Acute sinusitis, unspecified: Secondary | ICD-10-CM

## 2015-06-04 MED ORDER — SULFAMETHOXAZOLE-TRIMETHOPRIM 800-160 MG PO TABS: 1.0000 | ORAL_TABLET | Freq: Two times a day (BID) | ORAL | Status: DC

## 2015-06-04 MED ORDER — PROMETHAZINE-DM 6.25-15 MG/5ML PO SYRP: 5.0000 mL | ORAL_SOLUTION | Freq: Four times a day (QID) | ORAL | Status: DC | PRN

## 2015-06-04 NOTE — Progress Notes (Signed)
   Subjective:    Patient ID: Stacey [name], female    DOB: 1961/01/04, 54 y.o.   MRN: [id number]  HPI URI- pt has similar sxs in mid November.  + sick contacts over the holidays.  + sore throat starting 12/24.  + dry cough.  + head congestion.  + pain behind eyes.  Bilateral ear fullness.  No tooth pain.  No fevers.  No N/V.  Pt had course of abx 5 weeks ago (amox) for similar.   Review of Systems For ROS see HPI     Objective:   Physical Exam  Constitutional: She is oriented to person, place, and time. She appears well-developed and well-nourished. No distress.  HENT:  Head: Normocephalic and atraumatic.  Right Ear: Tympanic membrane normal.  Left Ear: Tympanic membrane normal.  Nose: Mucosal edema and rhinorrhea present. Right sinus exhibits maxillary sinus tenderness and frontal sinus tenderness. Left sinus exhibits maxillary sinus tenderness and frontal sinus tenderness.  Mouth/Throat: Uvula is midline and mucous membranes are normal. Posterior oropharyngeal erythema present. No oropharyngeal exudate.  Eyes: Conjunctivae and EOM are normal. Pupils are equal, round, and reactive to light.  Neck: Normal range of motion. Neck supple.  Cardiovascular: Normal rate, regular rhythm and normal heart sounds.   Pulmonary/Chest: Effort normal and breath sounds normal. No respiratory distress. She has no wheezes.  Dry, hacking cough  Lymphadenopathy:    She has no cervical adenopathy.  Neurological: She is alert and oriented to person, place, and time.  Vitals reviewed.         Assessment & Plan:

## 2015-06-04 NOTE — Assessment & Plan Note (Signed)
Pt's sxs and PE consistent w/ infxn.  Start Bactrim as pt has recently completed Amoxicillin for similar sxs.  Cough meds prn.  Reviewed supportive care and red flags that should prompt return.  Pt expressed understanding and is in agreement w/ plan.

## 2015-06-04 NOTE — Progress Notes (Signed)
Pre visit review using our clinic review tool, if applicable. No additional management support is needed unless otherwise documented below in the visit note. 

## 2015-06-04 NOTE — Patient Instructions (Signed)
Follow up as needed Start the Bactrim twice daily- take w/ food Drink plenty of fluids Start Mucinex DM for cough and congestion Cough syrup at night- will cause drowsiness REST! Call with any questions or concerns Hang in there!! Happy New Year!!!

## 2015-06-05 ENCOUNTER — Ambulatory Visit (INDEPENDENT_AMBULATORY_CARE_PROVIDER_SITE_OTHER): Payer: BLUE CROSS/BLUE SHIELD | Admitting: Family Medicine

## 2015-06-05 ENCOUNTER — Encounter: Payer: Self-pay | Admitting: Family Medicine

## 2015-06-05 VITALS — BP 107/77 | HR 80 | Ht 61.0 in | Wt 135.0 lb

## 2015-06-05 DIAGNOSIS — M25511 Pain in right shoulder: Secondary | ICD-10-CM | POA: Diagnosis not present

## 2015-06-05 NOTE — Patient Instructions (Signed)
We will go ahead with an MRI with attention to the clavicle, sternoclavicular joint to rule out malignancy (highly unlikely). This is most consistent with either arthritis of the [place] joint or capsular hypertrophy - both are benign conditions. No other treatment is necessary at this time.

## 2015-06-06 DIAGNOSIS — M25519 Pain in unspecified shoulder: Secondary | ICD-10-CM | POA: Insufficient documentation

## 2015-06-06 NOTE — Assessment & Plan Note (Signed)
Right [place] swelling - Most likely due to DJD vs capsular hypertrophy.  Radiographs, ultrasound performed but not definitive.  No evidence infection.  Need to rule out malignancy with MRI.  She has recent normal mammogram and colonoscopy.  Will call patient with results.  If no evidence malignancy, no further workup needed.

## 2015-06-06 NOTE — Progress Notes (Addendum)
PCP: Garnet Koyanagi, DO  Referred by: [facility]  Subjective:   HPI: Patient is a 54 y.o. female here for right clavicle swelling.  Patient comes in today with painless swelling medial aspect of right clavicle. Pain 0/10. Does not hurt with specific motions. Noticed this about 1 week ago when looking in the mirror. No sweats, chills, recent bacterial illness, fevers. No drug use. No trauma recently or remotely to this area. Up to date on mammogram and colonoscopy - no abnormalities on these. No redness.  Past Medical History  Diagnosis Date  . Hyperlipidemia   . 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 Thyroid disease 2008    HYPOTHYROIDISM  . Pollen allergies     Current Outpatient Prescriptions on File Prior to Visit  Medication Sig Dispense Refill  . cetirizine (ZYRTEC) 10 MG tablet Take 10 mg by mouth daily.    . Cholecalciferol (VITAMIN D) 1000 UNITS capsule Take 1,000 Units by mouth daily.      . Cyanocobalamin (B-12) 2500 MCG TABS Take 1 tablet by mouth daily.    . fluticasone (FLONASE) 50 MCG/ACT nasal spray Place 2 sprays into both nostrils daily. 16 g 6  . levothyroxine (SYNTHROID, LEVOTHROID) 75 MCG tablet TAKE ONE TABLET BY MOUTH ONCE DAILY BEFORE BREAKFAST 30 tablet 5  . meclizine (ANTIVERT) 25 MG tablet TAKE ONE TABLET BY MOUTH THREE TIMES DAILY AS NEEDED FOR DIZZINESS 30 tablet 0  . Multiple Vitamin (MULTIVITAMIN) tablet Take 1 tablet by mouth daily.      . promethazine-dextromethorphan (PROMETHAZINE-DM) 6.25-15 MG/5ML syrup Take 5 mLs by mouth 4 (four) times daily as needed. 240 mL 0  . sulfamethoxazole-trimethoprim (BACTRIM DS,SEPTRA DS) 800-160 MG tablet Take 1 tablet by mouth 2 (two) times daily. 20 tablet 0   No current facility-administered medications on file prior to visit.    Past Surgical History  Procedure Laterality Date  . Cesarean section  1992  . Popliteal synovial cyst excision Left 1974    Allergies  Allergen Reactions  . Other     TREES, POLLEN,  DUST, MOLD AND ANIMALS    Social History   Social History  . Marital Status: Married    Spouse Name: N/A  . Number of Children: 2  . Years of Education: N/A   Occupational History  . Training and development officer    Social History Main Topics  . Smoking status: Never Smoker   . Smokeless tobacco: Never Used  . Alcohol Use: No  . Drug Use: No  . Sexual Activity:    Partners: Male    Birth Control/ Protection: Condom   Other Topics Concern  . Not on file   Social History Narrative    Family History  Problem Relation Age of Onset  . Prostate cancer    . Cancer Father 81    prostate  . Diabetes Daughter   . Celiac disease Daughter   . Anuerysm Maternal Grandfather   . Diabetes Daughter   . Colon cancer Neg Hx     BP 107/77 mmHg  Pulse 80  Ht 5\' 1"  (1.549 m)  Wt 135 lb (61.236 kg)  BMI 25.52 kg/m2  LMP 07/02/2011  Review of Systems: See HPI above.    Objective:  Physical Exam:  Gen: NAD  Right shoulder: Prominence of medial clavicle at [facility] joint.  No other swelling, bruising deformity. No TTP. FROM without pain.Danne Baxter, [place]. Strength 5/5 with empty can and resisted internal/external rotation. NV intact distally.  Left shoulder: FROM without swelling,  deformity.    Assessment & Plan:  1. Right [place] swelling - Most likely due to DJD vs capsular hypertrophy.  Radiographs, ultrasound performed but not definitive.  No evidence infection.  Need to rule out malignancy with MRI.  She has recent normal mammogram and colonoscopy.  Will call patient with results.  If no evidence malignancy, no further workup needed.  Addendum:  MRI reviewed and discussed with patient.  No evidence mass of [place] joint or proximal clavicle.  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  Patient was reassured.  MRI of shoulder showed supraspinatus full thickness tear however patient denies injury - she has full motion, strength without any pain.  Advised against any further  workup or treatment for this given lack of symptoms, full motion and strength.

## 2015-06-12 ENCOUNTER — Telehealth: Payer: Self-pay | Admitting: Family Medicine

## 2015-06-12 NOTE — Telephone Encounter (Signed)
Still no word from the imaging facility - Nevin Bloodgood can you check on this for me?  Thanks!

## 2015-06-12 NOTE — Telephone Encounter (Signed)
Willette Cluster - you can let patient know we got the preliminary report but it didn't specifically mention the area we are looking at.  We are waiting for the imaging place to contact us back about that.

## 2015-06-26 ENCOUNTER — Encounter: Payer: Self-pay | Admitting: Family Medicine

## 2015-06-30 ENCOUNTER — Other Ambulatory Visit: Payer: Self-pay | Admitting: Family

## 2015-06-30 NOTE — Telephone Encounter (Signed)
Refill sent of levothyroxine. Pt is due for 6 month f/u with Dr Etter Sjogren on 08/02/15.  Please call pt to arrange appt.  Thanks!

## 2015-07-01 NOTE — Telephone Encounter (Signed)
OV Scheduled with patient for 07/31/2015 @ 4:15

## 2015-07-31 ENCOUNTER — Ambulatory Visit: Payer: BLUE CROSS/BLUE SHIELD | Admitting: Family Medicine

## 2015-08-04 ENCOUNTER — Ambulatory Visit (INDEPENDENT_AMBULATORY_CARE_PROVIDER_SITE_OTHER): Payer: BLUE CROSS/BLUE SHIELD | Admitting: Family Medicine

## 2015-08-04 ENCOUNTER — Encounter: Payer: Self-pay | Admitting: Family Medicine

## 2015-08-04 VITALS — BP 118/80 | HR 87 | Temp 100.5°F | Wt 125.2 lb

## 2015-08-04 DIAGNOSIS — R509 Fever, unspecified: Secondary | ICD-10-CM | POA: Diagnosis not present

## 2015-08-04 DIAGNOSIS — J101 Influenza due to other identified influenza virus with other respiratory manifestations: Secondary | ICD-10-CM | POA: Diagnosis not present

## 2015-08-04 LAB — POCT INFLUENZA A/B
Influenza A, POC: POSITIVE — AB
Influenza B, POC: NEGATIVE

## 2015-08-04 MED ORDER — PROMETHAZINE-DM 6.25-15 MG/5ML PO SYRP: 5.0000 mL | ORAL_SOLUTION | Freq: Four times a day (QID) | ORAL | Status: DC | PRN

## 2015-08-04 MED ORDER — OSELTAMIVIR PHOSPHATE 75 MG PO CAPS: 75.0000 mg | ORAL_CAPSULE | Freq: Two times a day (BID) | ORAL | Status: DC

## 2015-08-04 NOTE — Assessment & Plan Note (Signed)
tamiflu Tylenol or IB for fever and bodyaches Drink plenty of fluids and rest

## 2015-08-04 NOTE — Addendum Note (Signed)
Addended by: Ewing Schlein on: 08/04/2015 03:07 PM   Modules accepted: Orders

## 2015-08-04 NOTE — Progress Notes (Signed)
Patient ID: Stacey [name], female    DOB: 10-07-1960  Age: 55 y.o. MRN: [id number]    Subjective:  Subjective HPI Kristiane Bhandari presents for flu like symptoms.  + bodyaches, fever and cough since Friday.  Fever started Sat night.    Review of Systems  Constitutional: Positive for chills. Negative for fever.  HENT: Positive for congestion, postnasal drip, rhinorrhea and sinus pressure.   Respiratory: Positive for cough and chest tightness. Negative for shortness of breath and wheezing.   Cardiovascular: Negative for chest pain, palpitations and leg swelling.  Allergic/Immunologic: Negative for environmental allergies.    History Past Medical History  Diagnosis Date  . Hyperlipidemia   . 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 Thyroid disease 2008    HYPOTHYROIDISM  . Pollen allergies     She has past surgical history that includes Cesarean section (1992) and Popliteal synovial cyst excision (Left, 1974).   Her family history includes Anuerysm in her maternal grandfather; Cancer (age of onset: 71) in her father; Celiac disease in her daughter; Diabetes in her daughter and daughter. There is no history of Colon cancer.She reports that she has never smoked. She has never used smokeless tobacco. She reports that she does not drink alcohol or use illicit drugs.  Current Outpatient Prescriptions on File Prior to Visit  Medication Sig Dispense Refill  . cetirizine (ZYRTEC) 10 MG tablet Take 10 mg by mouth daily.    . Cholecalciferol (VITAMIN D) 1000 UNITS capsule Take 1,000 Units by mouth daily.      . Cyanocobalamin (B-12) 2500 MCG TABS Take 1 tablet by mouth daily.    . fluticasone (FLONASE) 50 MCG/ACT nasal spray Place 2 sprays into both nostrils daily. 16 g 6  . meclizine (ANTIVERT) 25 MG tablet TAKE ONE TABLET BY MOUTH THREE TIMES DAILY AS NEEDED FOR DIZZINESS 30 tablet 0  . Multiple Vitamin (MULTIVITAMIN) tablet Take 1 tablet by mouth daily.       No current facility-administered medications on file  prior to visit.     Objective:  Objective Physical Exam  Constitutional: She is oriented to person, place, and time. She appears well-developed and well-nourished.  HENT:  Head: Normocephalic and atraumatic.  Eyes: Conjunctivae and EOM are normal.  Neck: Normal range of motion. Neck supple. No JVD present. Carotid bruit is not present. No thyromegaly present.  Cardiovascular: Normal rate, regular rhythm and normal heart sounds.   No murmur heard. Pulmonary/Chest: Effort normal and breath sounds normal. No respiratory distress. She has no wheezes. She has no rales. She exhibits no tenderness.  Musculoskeletal: She exhibits no edema.  Neurological: She is alert and oriented to person, place, and time.  Psychiatric: She has a normal mood and affect. Her behavior is normal. Judgment and thought content normal.  Nursing note and vitals reviewed.  BP 118/80 mmHg  Pulse 87  Temp(Src) 100.5 F (38.1 C) (Oral)  Wt 125 lb 3.2 oz (56.79 kg)  SpO2 99%  LMP 07/02/2011 Wt Readings from Last 3 Encounters:  08/04/15 125 lb 3.2 oz (56.79 kg)  06/05/15 135 lb (61.236 kg)  06/04/15 138 lb (62.596 kg)     Lab Results  Component Value Date   WBC 5.3 01/30/2015   HGB 14.6 01/30/2015   HCT 43.4 01/30/2015   PLT 234 01/30/2015   GLUCOSE 133* 01/30/2015   CHOL 230* 12/13/2014   TRIG 89.0 12/13/2014   HDL 66.50 12/13/2014   LDLCALC 146* 12/13/2014   ALT 16 12/13/2014   AST 23 12/13/2014  NA 137 01/30/2015   K 4.3 01/30/2015   CL 100* 01/30/2015   CREATININE 0.78 01/30/2015   BUN 21* 01/30/2015   CO2 29 01/30/2015   TSH 2.26 12/13/2014   HGBA1C 5.9* 02/21/2015   MICROALBUR 0.7 12/13/2014    [place] Chest  05/29/2015  CLINICAL DATA:  C/o palpable hard painless lump over medial end of Rt clavicle x 2 days, patient had an xray of clavicle to eval. EXAM: CHEST ULTRASOUND COMPARISON:  None. FINDINGS: There is hypo echogenicity surrounding the medial margin of the right clavicle extending to  the sternoclavicular joint. This is likely capsular hypertrophy. There may be a joint effusion. Echogenic foci are seen within this. This is asymmetric when compared to the left side. IMPRESSION: Abnormality surrounding the right clavicular head and right sternoclavicular joint. This is likely either capsular hypertrophy or capsular distention from a joint effusion. If further imaging is desired clinically, this would be best performed with MRI or CT. MRI would be the most sensitive and specific modality. Electronically Signed   By: Lajean Manes M.D.   On: 05/29/2015 16:00     Assessment & Plan:  Plan I have discontinued Ms. Cardoza's sulfamethoxazole-trimethoprim, promethazine-dextromethorphan, and levothyroxine. I am also having her start on oseltamivir and promethazine-dextromethorphan. Additionally, I am having her maintain her Vitamin D, multivitamin, cetirizine, B-12, fluticasone, and meclizine.  Meds ordered this encounter  Medications  . oseltamivir (TAMIFLU) 75 MG capsule    Sig: Take 1 capsule (75 mg total) by mouth 2 (two) times daily.    Dispense:  10 capsule    Refill:  0  . promethazine-dextromethorphan (PROMETHAZINE-DM) 6.25-15 MG/5ML syrup    Sig: Take 5 mLs by mouth 4 (four) times daily as needed.    Dispense:  118 mL    Refill:  0    Problem List Items Addressed This Visit      Unprioritized   Influenza A - Primary    tamiflu Tylenol or IB for fever and bodyaches Drink plenty of fluids and rest       Relevant Medications   oseltamivir (TAMIFLU) 75 MG capsule   promethazine-dextromethorphan (PROMETHAZINE-DM) 6.25-15 MG/5ML syrup      Follow-up: Return if symptoms worsen or fail to improve.  Garnet Koyanagi, DO

## 2015-08-04 NOTE — Progress Notes (Signed)
Pre visit review using our clinic review tool, if applicable. No additional management support is needed unless otherwise documented below in the visit note. 

## 2015-08-04 NOTE — Patient Instructions (Signed)

## 2015-08-22 ENCOUNTER — Telehealth: Payer: Self-pay | Admitting: Family Medicine

## 2015-08-22 NOTE — Telephone Encounter (Signed)
Murrell Redden please call her and tell her the person we spoke to about this issue to resolve it was [place]

## 2015-08-22 NOTE — Telephone Encounter (Signed)
Murrell Redden please call her and tell her the person we spoke to about this issue to resolve it was Lauraine Rinne - she is their Nurse, learning disability we spoke with at length back in January.  I would recommend she ask for her when she calls [facility].

## 2015-08-22 NOTE — Telephone Encounter (Signed)
She asked me to email her the name you mentioned at [facility] for her to contact.  So, I did.  Then she emailed me back with this:  Willette Cluster, Could you send me the contact information for [facility], and if you have a number also for [place] that would be great. The only number I have is for the billing department of [facility] in [place], [place]. Thank you so much for your help with this. Georgana Curio

## 2015-08-25 NOTE — Telephone Encounter (Signed)
Stacey [name] - can you call her and speak to her about this?  It sounds like [facility] waived the entire cost of the MRI but she received a bill for the interpretation of the MRI.  If that is the case have her contact the Radiology group directly and let us know what they say, let us know back when she hears from them.  I don't have any contact information for the people who interpreted the MRI.

## 2015-08-28 ENCOUNTER — Other Ambulatory Visit: Payer: Self-pay | Admitting: Family Medicine

## 2015-09-01 ENCOUNTER — Other Ambulatory Visit: Payer: Self-pay | Admitting: Family Medicine

## 2015-09-05 ENCOUNTER — Ambulatory Visit (INDEPENDENT_AMBULATORY_CARE_PROVIDER_SITE_OTHER): Payer: BLUE CROSS/BLUE SHIELD | Admitting: Family Medicine

## 2015-09-05 ENCOUNTER — Ambulatory Visit: Payer: BLUE CROSS/BLUE SHIELD | Admitting: Family Medicine

## 2015-09-05 VITALS — BP 108/62 | HR 85 | Temp 98.2°F | Ht 61.5 in | Wt 126.0 lb

## 2015-09-05 DIAGNOSIS — R739 Hyperglycemia, unspecified: Secondary | ICD-10-CM | POA: Diagnosis not present

## 2015-09-05 DIAGNOSIS — Z1159 Encounter for screening for other viral diseases: Secondary | ICD-10-CM

## 2015-09-05 DIAGNOSIS — E538 Deficiency of other specified B group vitamins: Secondary | ICD-10-CM | POA: Diagnosis not present

## 2015-09-05 DIAGNOSIS — E039 Hypothyroidism, unspecified: Secondary | ICD-10-CM | POA: Diagnosis not present

## 2015-09-05 DIAGNOSIS — Z8379 Family history of other diseases of the digestive system: Secondary | ICD-10-CM

## 2015-09-05 LAB — HEPATITIS C ANTIBODY: HCV Ab: NEGATIVE

## 2015-09-05 LAB — BASIC METABOLIC PANEL
BUN: 14 mg/dL (ref 6–23)
CO2: 32 mEq/L (ref 19–32)
Calcium: 9.5 mg/dL (ref 8.4–10.5)
Chloride: 103 mEq/L (ref 96–112)
Creatinine, Ser: 0.71 mg/dL (ref 0.40–1.20)
GFR: 91.52 mL/min (ref >60.00)
Glucose, Bld: 100 mg/dL — ABNORMAL HIGH (ref 70–99)
Potassium: 4 mEq/L (ref 3.5–5.1)
Sodium: 138 mEq/L (ref 135–145)

## 2015-09-05 LAB — HEMOGLOBIN A1C: Hgb A1c MFr Bld: 6 % (ref 4.6–6.5)

## 2015-09-05 NOTE — Patient Instructions (Addendum)
Gluten-Free Diet for Celiac Disease Gluten is a protein found in wheat, rye, barley, and triticale (a cross between wheat and rye) grains. People with celiac disease need to have a gluten-free diet. With celiac disease, gluten interferes with the absorption of food and may also cause intestinal injury.  Strict compliance is important even during symptom-free periods. This means eliminating all foods with gluten from your diet permanently. This requires some significant changes but is very manageable. WHAT DO I NEED TO KNOW ABOUT A GLUTEN-FREE DIET?  Look for items labeled with "GF." Looking for GF will make it easier to identify products that are safe to eat.  Read all labels. Gluten may have been added as a minor ingredient where least expected, such as in shredded cheeses or ice creams. Always check food labels and investigate questionable ingredients. Talk to your dietitian or health care provider if you have questions about certain foods or need help finding GF foods.  Check when in doubt. If you are not sure whether an ingredient contains gluten, check with the manufacturer. Note that some manufacturers may change ingredients without notice. Always read labels.   Know how food is prepared. Since flour and cereal products are often used in the preparation of foods, it is important to be aware of the methods of preparation used, as well as the ingredients in the foods themselves. This is especially true when you are dining out. Ask restaurants if they have a gluten-free menu.  Watch for cross-contamination. Cross-contamination occurs when gluten-free foods come into contact with foods that contain gluten. It often happens during the manufacturing process. Always check the ingredient list and for warnings on packages, such as "may contain gluten."  Eat a balanced diet. It is important to still get enough fiber, iron, and B vitamins in your diet. Look for enriched whole grain gluten-free products  and continue to eat a well-balanced diet of the important non-grain items, such as vegetables, fruit, lean proteins, legumes, and dairy.  Consider taking a gluten-free multivitamin and mineral supplement. Discuss this with your health care provider. WHAT KEY WORDS HELP IDENTIFY GLUTEN? Know key words to help identify gluten. A dietitian can help you identify possible harmful ingredients in the foods you normally eat. Words to check for on food labels include:   Flour, enriched flour, bromated flour, white flour, durum flour, graham flour, phosphated flour, self-rising flour, semolina, or farina.  Starch, dextrin, modified food starch, or cereal.  Thickening, fillers, or emulsifiers.  Any kind of malt flavoring, extract, or syrup (malt is made from barley and includes malt vinegar, malted milk, and malted beverages).  Hydrolyzed vegetable protein. WHAT FOODS CAN I EAT? Below is a list of common foods that are allowed with a gluten-free diet.  Grains Products made from the following flours or grains:amaranth,bean flours, 100% buckwheat flour, corn, millet, nut flours or meals, GF oats, quinoa, rice, sorghum, teff, any all-purpose 100% GF flour mix, rice wafers, pure cornmeal tortillas, popcorn, some crackers, some chips, and hot cereals made from cornmeal. Ask your dietitian which specific hot and cold cereals are allowed. Hominy, rice or wild rice, and special GF pasta. Some Asian rice noodles or bean noodles. Arrowroot starch, corn bran, corn flour, corn germ, cornmeal, corn starch, potato flour, potato starch flour, and rice bran. Rice flours: plain, brown, and sweet. Rice polish, soy flour, tapioca starch. Vegetables All plain, fresh, frozen, or canned vegetables.  Fruits All fresh, frozen, canned, dried fruits, and fruit juices.  Meats and Other   Protein Foods Meat, fish, poultry, or eggs prepared without added wheat, rye, barley, or triticale. Some luncheon meat and some frankfurters.  Pure meat. All aged cheese, most processed cheese products, some cottage cheese, and some cream cheese. Dried beans, dried peas, and lentils.  Dairy Milk and yogurt made with allowed ingredients.  Beverages Coffee (regular or decaffeinated), tea, herbal tea (read label to be sure that no wheat flour has been added). Carbonated beverages and some root beers. Wine, sake, and distilled spirits, such as gin, vodka, and whiskey. GF beers and GF ciders.  Sweetsand Desserts Sugar, honey, some syrups, molasses, jelly, jam, plain hard candy, marshmallows, gumdrops, homemade candies free of wheat, rye, barley, or triticale. Coconut. Custard, some pudding mixes, and homemade puddings from cornstarch, rice, and tapioca. Gelatin desserts, sorbets, frozen ice pops, and sherbet. Cake, cookies, and other desserts prepared with allowed flours. Some commercial ice creams. Ask your dietitian about specific brands of dessert that are allowed.  Fats and Oils Butter, margarine, vegetable oil, sour cream not containing modified food starch, whipping cream, shortening, lard, cream, and some mayonnaise. Some commercial salad dressings. Peanut butter.  Other Homemade broth and soups made with allowed ingredients; some canned or frozen soups. Any other combination or prepared foods that do not contain gluten. Monosodium glutamate (MSG). Cider, rice, and wine vinegar. Baking soda and baking powder. Certain soy sauces (Tamari). Ask your dietitian about specific brands that are allowed. Nuts, coconut, chocolate, and pure cocoa powder. Salt, pepper, herbs, spices, extracts, and food colorings. The items listed above may not be a complete list of allowed foods or beverages. Contact your dietitian for more options.  WHAT FOODS CAN I NOT EAT? Below is a list of common foods that are not allowed with a gluten-free diet.  Grains Barley, bran, bulgur, cracked wheat, graham, malt, matzo, wheat germ, and all wheat and rye cereals  including spelt and kamut. Avoid cereals containing malt as a flavoring, such as rice cereal. Also avoid regular noodles, spaghetti, macaroni, and most packaged rice mixes, and all others containing wheat, rye, barley, or triticale.  Vegetables Most creamed vegetables, most vegetables canned in sauces, and any vegetables prepared with wheat, rye, barley, or triticale.  Fruits Thickened or prepared fruits and some pie fillings.  Meats and Other Protein Sources Any meat or meat alternative containing wheat, rye, barley, or gluten stabilizers (such as some hot dogs, salami, cold cuts, or sausage). Bread-containing products, such as Swiss steak, croquettes, and meatloaf. Most tuna canned in vegetable broth, turkey with hydrolyzed vegetable protein (HVP) injected as part of the basting, and any cheese product containing oat gum as an ingredient. Seitan. Imitation fish. Dairy Commercial chocolate milk, which may have cereal added, and malted milk. Beverages Certain cereal beverages. Beer and ciders (unless GF), ale, malted milk, and some root beers. Sweetsand Desserts Commercial candies containing wheat, rye, barley, or triticale. Certain toffees are dusted with wheat flour. Chocolate-coated nuts, which are often rolled in flour. Cakes, cookies, doughnuts, and pastries that are prepared with wheat, barley, rye, or triticale flour. Some commercial ice creams, ice cream flavors which contain cookies, crumbs, or cheesecake. Ice cream cones. Commercially prepared mixes for cakes, cookies, and other desserts unless marked GF. Bread pudding and other puddings thickened with flour. Fats and Oils Some commercial salad dressings and sour cream containing modified food starch.  Condiments Some curry powder, some dry seasoning mixes, some gravy extracts, some meat sauces, some ketchup, some prepared mustard, horseradish. Other All soups containing wheat,   rye, barley, or triticale flour. Bouillon and bouillon  cubes that contain HVP. Combination or prepared foods that contain gluten. Some soy sauce, some chip dips, and some chewing gum. Yeast extract (contains barley). Caramel color (may contain malt). The items listed above may not be a complete list of foods and beverages to avoid. Contact your dietitian for more information.   This information is not intended to replace advice given to you by your health care provider. Make sure you discuss any questions you have with your health care provider.   Document Released: 05/24/2005 Document Revised: 06/14/2014 Document Reviewed: 03/28/2013 Elsevier Interactive Patient Education 2016 Elsevier Inc.  

## 2015-09-05 NOTE — Progress Notes (Signed)
Pre visit review using our clinic review tool, if applicable. No additional management support is needed unless otherwise documented below in the visit note. 

## 2015-09-05 NOTE — Progress Notes (Signed)
Subjective:    Patient ID: Stacey [name], female    DOB: January 28, 1961, 55 y.o.   MRN: [id number]  Chief Complaint  Patient presents with  . Follow-up    6 month    HPI Patient is in today for follow up on lab work.  She has been working on diet and exercise.  No other complaints.   Pt is requesting a celiac panal be done because her daughter was dx with celiac and she knows it runs in the family.  Past Medical History  Diagnosis Date  . Hyperlipidemia   . 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 Thyroid disease 2008    HYPOTHYROIDISM  . Pollen allergies     Past Surgical History  Procedure Laterality Date  . Cesarean section  1992  . Popliteal synovial cyst excision Left 1974    Family History  Problem Relation Age of Onset  . Prostate cancer    . Cancer Father 43    prostate  . Diabetes Daughter   . Celiac disease Daughter   . Anuerysm Maternal Grandfather   . Diabetes Daughter   . Colon cancer Neg Hx     Social History   Social History  . Marital Status: Married    Spouse Name: N/A  . Number of Children: 2  . Years of Education: N/A   Occupational History  . Training and development officer    Social History Main Topics  . Smoking status: Never Smoker   . Smokeless tobacco: Never Used  . Alcohol Use: No  . Drug Use: No  . Sexual Activity:    Partners: Male    Birth Control/ Protection: Condom   Other Topics Concern  . Not on file   Social History Narrative    Outpatient Prescriptions Prior to Visit  Medication Sig Dispense Refill  . cetirizine (ZYRTEC) 10 MG tablet Take 10 mg by mouth daily.    . Cholecalciferol (VITAMIN D) 1000 UNITS capsule Take 1,000 Units by mouth daily.      . Cyanocobalamin (B-12) 2500 MCG TABS Take 1 tablet by mouth daily.    . fluticasone (FLONASE) 50 MCG/ACT nasal spray Place 2 sprays into both nostrils daily. 16 g 6  . levothyroxine (SYNTHROID, LEVOTHROID) 75 MCG tablet TAKE ONE TABLET BY MOUTH ONCE DAILY BEFORE  BREAKFAST 30 tablet 0  . levothyroxine  (SYNTHROID, LEVOTHROID) 75 MCG tablet TAKE ONE TABLET BY MOUTH ONCE DAILY BEFORE  BREAKFAST 30 tablet 0  . meclizine (ANTIVERT) 25 MG tablet TAKE ONE TABLET BY MOUTH THREE TIMES DAILY AS NEEDED FOR DIZZINESS 30 tablet 0  . Multiple Vitamin (MULTIVITAMIN) tablet Take 1 tablet by mouth daily.      Marland Kitchen oseltamivir (TAMIFLU) 75 MG capsule Take 1 capsule (75 mg total) by mouth 2 (two) times daily. 10 capsule 0  . promethazine-dextromethorphan (PROMETHAZINE-DM) 6.25-15 MG/5ML syrup Take 5 mLs by mouth 4 (four) times daily as needed. 118 mL 0   No facility-administered medications prior to visit.    Allergies  Allergen Reactions  . Other     TREES, POLLEN, DUST, MOLD AND ANIMALS    Review of Systems  Constitutional: Negative for fever, chills and malaise/fatigue.  HENT: Negative for congestion and hearing loss.   Eyes: Negative for discharge.  Respiratory: Negative for cough, sputum production and shortness of breath.   Cardiovascular: Negative for chest pain, palpitations and leg swelling.  Gastrointestinal: Negative for heartburn, nausea, vomiting, abdominal pain, diarrhea, constipation and blood in stool.  Genitourinary: Negative for dysuria, urgency, frequency  and hematuria.  Musculoskeletal: Negative for myalgias, back pain and falls.  Skin: Negative for rash.  Neurological: Negative for dizziness, sensory change, loss of consciousness, weakness and headaches.  Endo/Heme/Allergies: Negative for environmental allergies. Does not bruise/bleed easily.  Psychiatric/Behavioral: Negative for depression and suicidal ideas. The patient is not nervous/anxious and does not have insomnia.        Objective:    Physical Exam  Constitutional: She is oriented to person, place, and time. She appears well-developed and well-nourished. No distress.  HENT:  Head: Normocephalic and atraumatic.  Eyes: Conjunctivae are normal.  Neck: Neck supple. No thyromegaly present.  Cardiovascular: Normal rate,  regular rhythm and normal heart sounds.   No murmur heard. Pulmonary/Chest: Effort normal and breath sounds normal. No respiratory distress.  Abdominal: Soft. Bowel sounds are normal. She exhibits no distension and no mass. There is no tenderness.  Musculoskeletal: She exhibits no edema.  Lymphadenopathy:    She has no cervical adenopathy.  Neurological: She is alert and oriented to person, place, and time.  Skin: Skin is warm and dry.  Psychiatric: She has a normal mood and affect. Her behavior is normal.  Nursing note and vitals reviewed.   BP 108/62 mmHg  Pulse 85  Temp(Src) 98.2 F (36.8 C) (Oral)  Ht 5' 1.5" (1.562 m)  Wt 126 lb (57.153 kg)  BMI 23.42 kg/m2  SpO2 94%  LMP 07/02/2011 Wt Readings from Last 3 Encounters:  09/05/15 126 lb (57.153 kg)  08/04/15 125 lb 3.2 oz (56.79 kg)  06/05/15 135 lb (61.236 kg)     Lab Results  Component Value Date   WBC 5.3 01/30/2015   HGB 14.6 01/30/2015   HCT 43.4 01/30/2015   PLT 234 01/30/2015   GLUCOSE 100* 09/05/2015   CHOL 230* 12/13/2014   TRIG 89.0 12/13/2014   HDL 66.50 12/13/2014   LDLCALC 146* 12/13/2014   ALT 16 12/13/2014   AST 23 12/13/2014   NA 138 09/05/2015   K 4.0 09/05/2015   CL 103 09/05/2015   CREATININE 0.71 09/05/2015   BUN 14 09/05/2015   CO2 32 09/05/2015   TSH 2.26 12/13/2014   HGBA1C 6.0 09/05/2015   MICROALBUR 0.7 12/13/2014    Lab Results  Component Value Date   TSH 2.26 12/13/2014   Lab Results  Component Value Date   WBC 5.3 01/30/2015   HGB 14.6 01/30/2015   HCT 43.4 01/30/2015   MCV 92.7 01/30/2015   PLT 234 01/30/2015   Lab Results  Component Value Date   NA 138 09/05/2015   K 4.0 09/05/2015   CO2 32 09/05/2015   GLUCOSE 100* 09/05/2015   BUN 14 09/05/2015   CREATININE 0.71 09/05/2015   BILITOT 0.6 12/13/2014   ALKPHOS 73 12/13/2014   AST 23 12/13/2014   ALT 16 12/13/2014   PROT 7.2 12/13/2014   ALBUMIN 4.2 12/13/2014   CALCIUM 9.5 09/05/2015   ANIONGAP 8  01/30/2015   GFR 91.52 09/05/2015   Lab Results  Component Value Date   CHOL 230* 12/13/2014   Lab Results  Component Value Date   HDL 66.50 12/13/2014   Lab Results  Component Value Date   LDLCALC 146* 12/13/2014   Lab Results  Component Value Date   TRIG 89.0 12/13/2014   Lab Results  Component Value Date   CHOLHDL 3 12/13/2014   Lab Results  Component Value Date   HGBA1C 6.0 09/05/2015       Assessment & Plan:   Problem List Items  Addressed This Visit      Unprioritized   Hypothyroidism - Primary   Vitamin B 12 deficiency   Hyperglycemia    Check hgba1c today con't diet       Relevant Orders   Basic Metabolic Panel (BMET) (Completed)   Hemoglobin A1c (Completed)   Family history of celiac disease    Check celiac panal        Relevant Orders   Celiac Panel (Completed)    Other Visit Diagnoses    Need for hepatitis C screening test        Relevant Orders    Hepatitis C antibody (Completed)       I have discontinued Ms. Ceci's oseltamivir and promethazine-dextromethorphan. I am also having her maintain her Vitamin D, multivitamin, cetirizine, B-12, fluticasone, meclizine, levothyroxine, and levothyroxine.  No orders of the defined types were placed in this encounter.     Ann Held, DO

## 2015-09-05 NOTE — Assessment & Plan Note (Signed)
Check hgba1c today con't diet

## 2015-09-05 NOTE — Assessment & Plan Note (Signed)
Check celiac panal

## 2015-09-06 ENCOUNTER — Encounter: Payer: Self-pay | Admitting: Family Medicine

## 2015-09-08 LAB — GLIA (IGA/G) + TTG IGA
Gliadin IgA: 7 Units (ref <20)
Gliadin IgG: 2 Units (ref <20)
Tissue Transglutaminase Ab, IgA: 1 U/mL (ref <4)

## 2015-09-09 IMAGING — CR DG CHEST 2V
2 series · 2 of 2 positions shown · non-contrast
Comparison: None.

CLINICAL DATA: Dizziness. Light internal tremors. Symphysis for 1
day. Some chest discomfort. Nonsmoker.

EXAM:
CHEST  2 VIEW

[w chest pa]
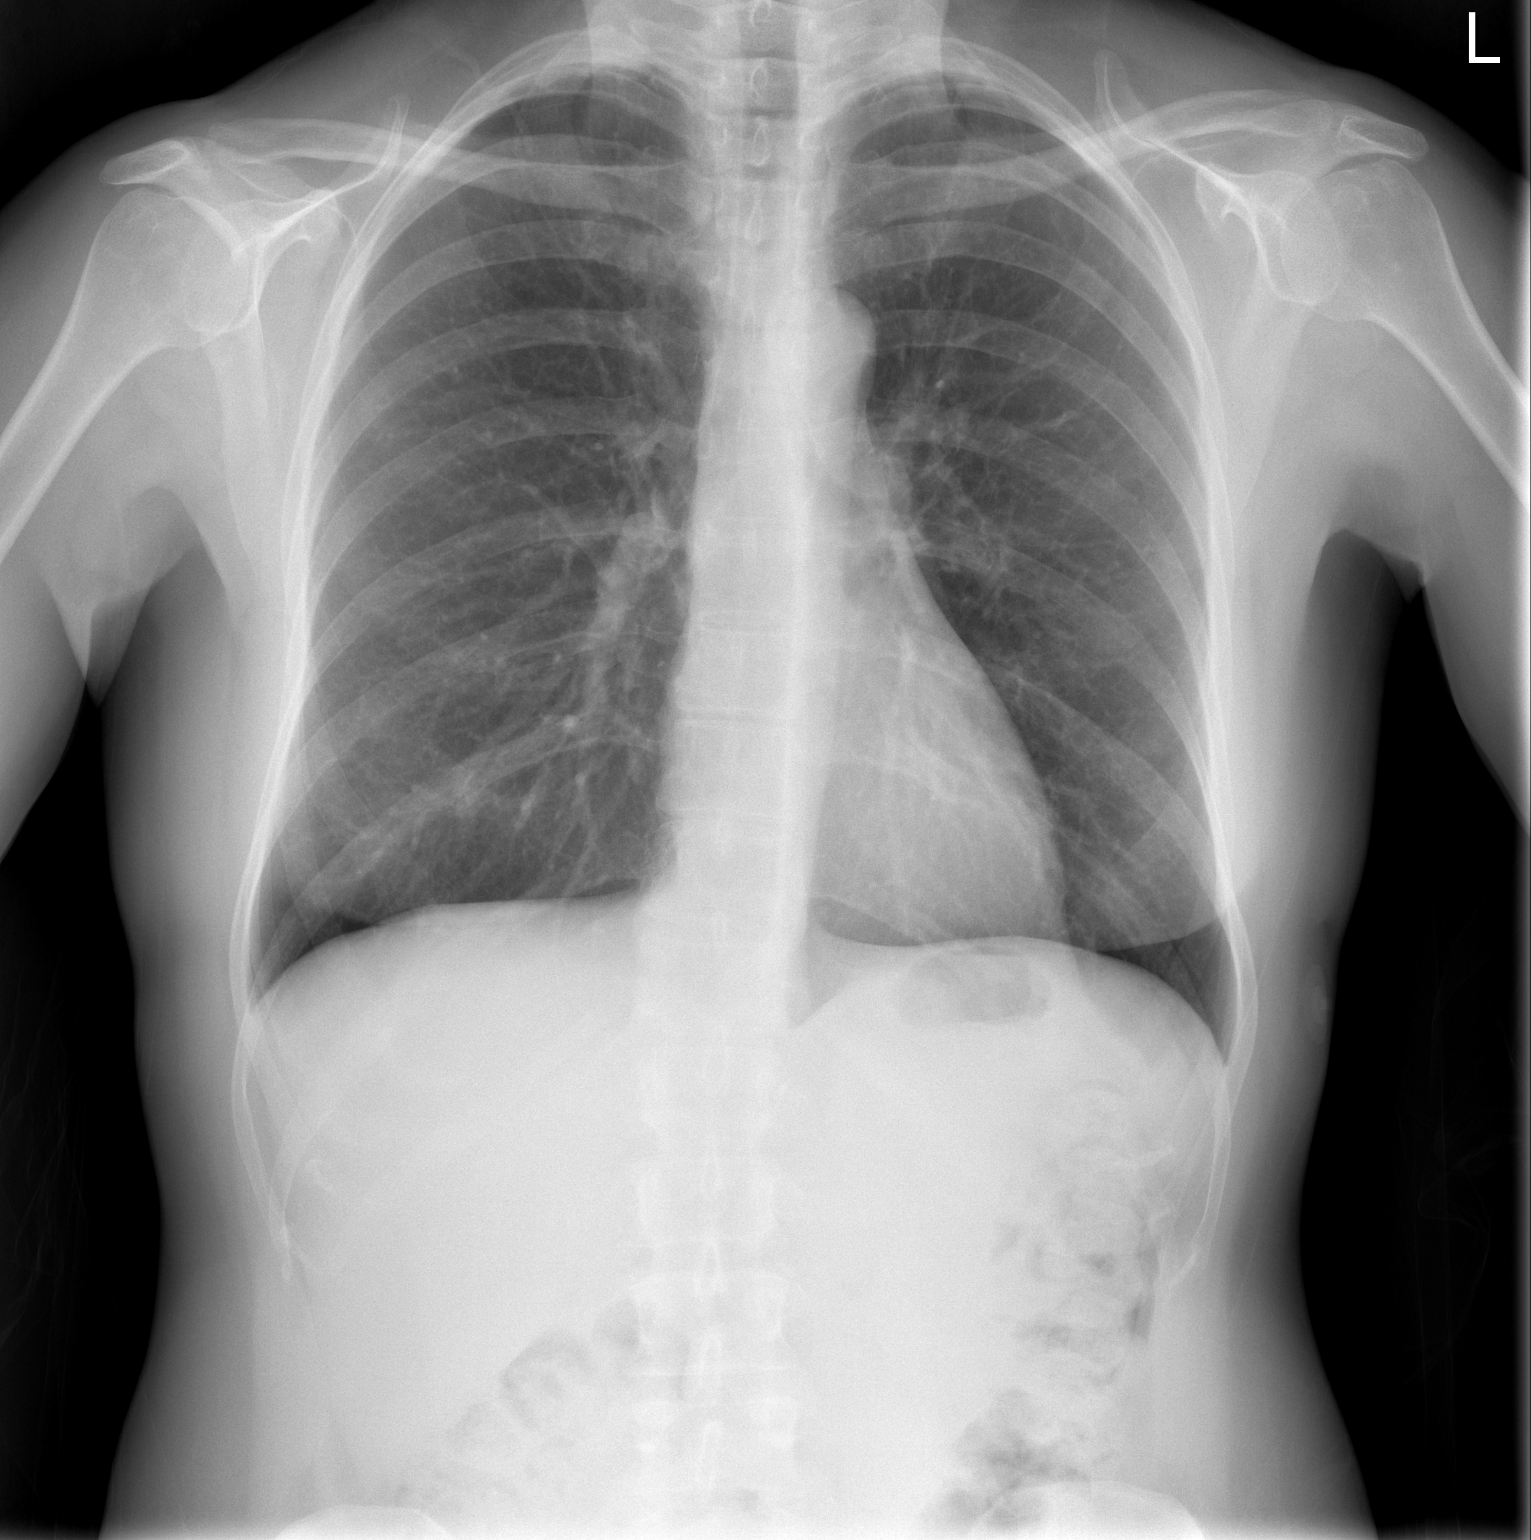

[w chest lat]
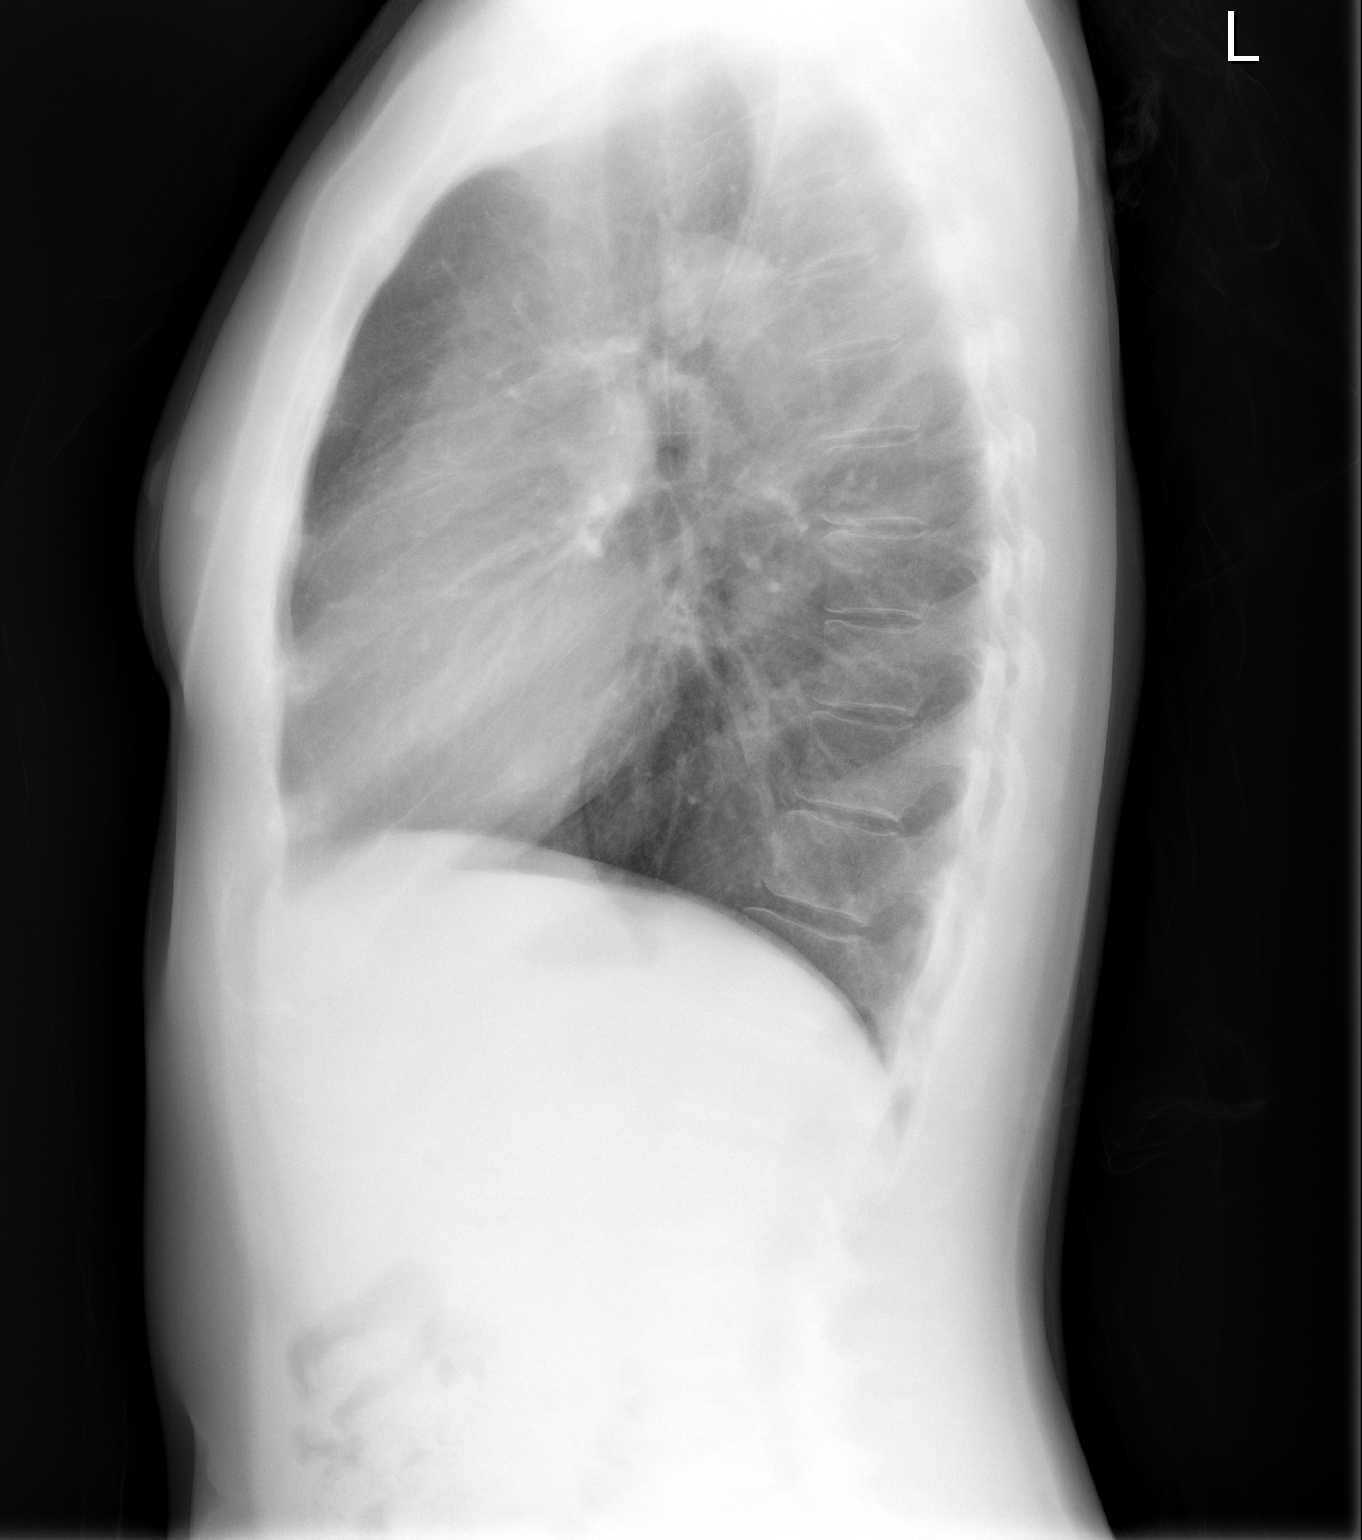

[2 of 2 positions shown; findings below may reference images not displayed]

FINDINGS: The heart size and mediastinal contours are within normal limits.
Both lungs are clear. The visualized skeletal structures are
unremarkable.
IMPRESSION: No active cardiopulmonary disease.

## 2015-10-23 ENCOUNTER — Telehealth: Payer: Self-pay | Admitting: Family Medicine

## 2015-10-24 NOTE — Telephone Encounter (Signed)
Spoke to patient and answered questions.

## 2015-10-28 ENCOUNTER — Other Ambulatory Visit: Payer: Self-pay | Admitting: Family Medicine

## 2016-01-05 ENCOUNTER — Other Ambulatory Visit: Payer: Self-pay | Admitting: Physician Assistant

## 2016-01-05 IMAGING — DX DG CLAVICLE*R*
2 series · 2 of 2 positions shown · non-contrast
Comparison: Chest radiograph 01/30/2015

CLINICAL DATA: Right clavicle bony prominence in the mid clavicle
region.

EXAM:
RIGHT CLAVICLE - 2+ VIEWS

[clavicle ap]
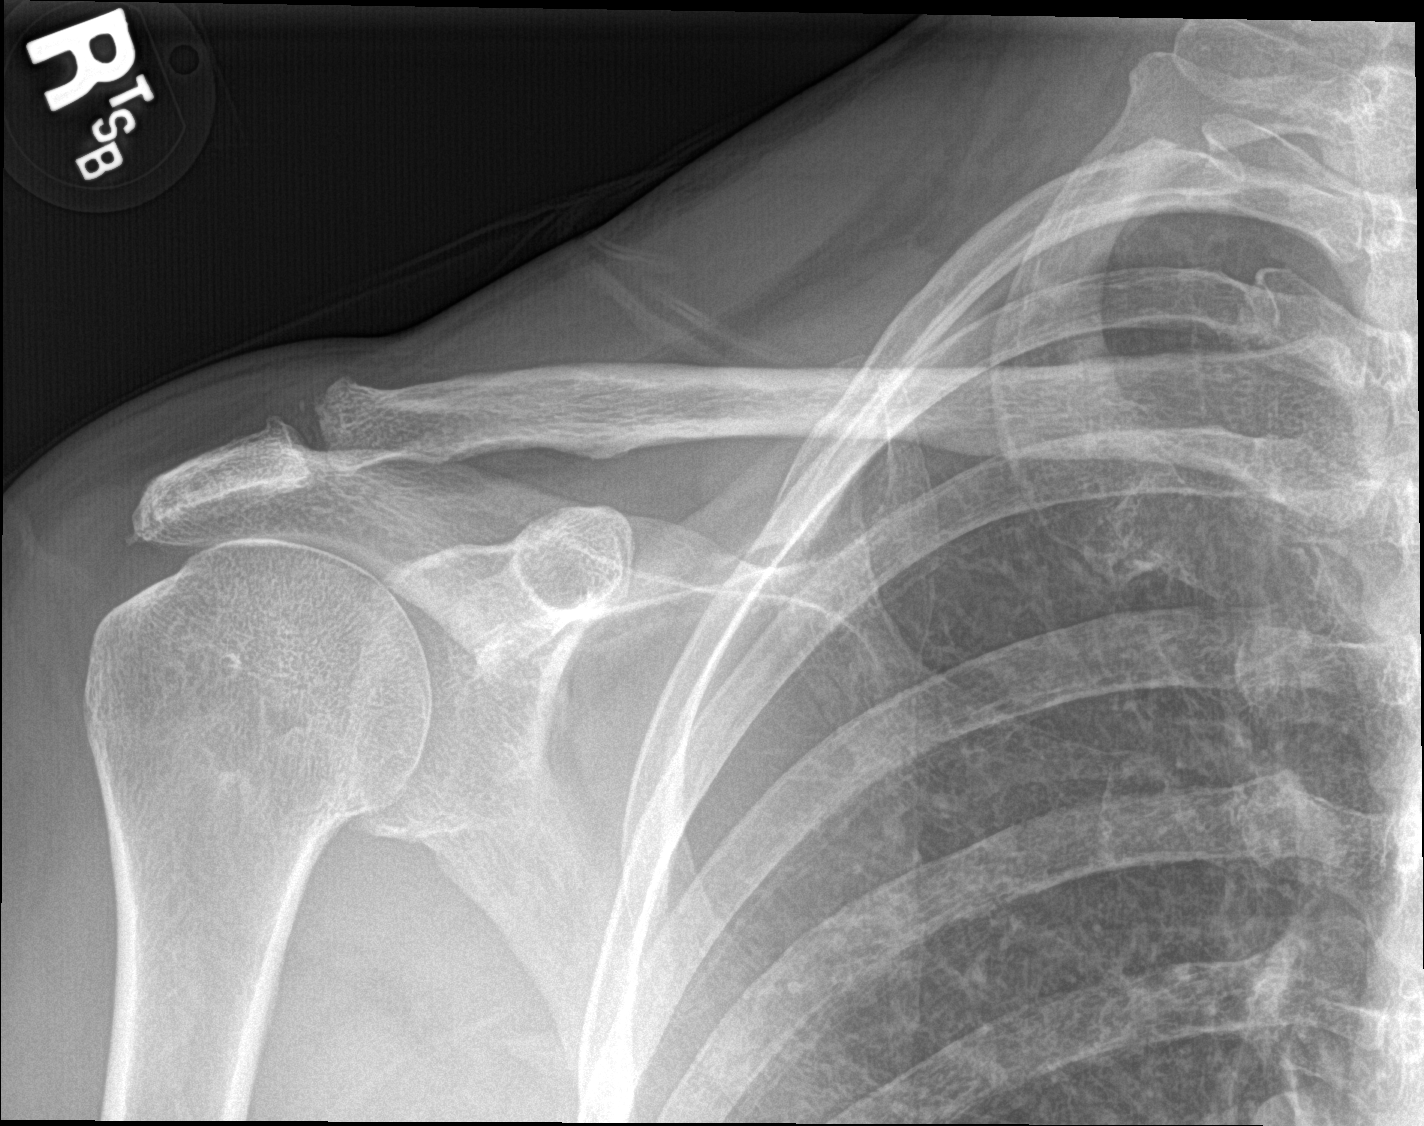

[clavicle axial]
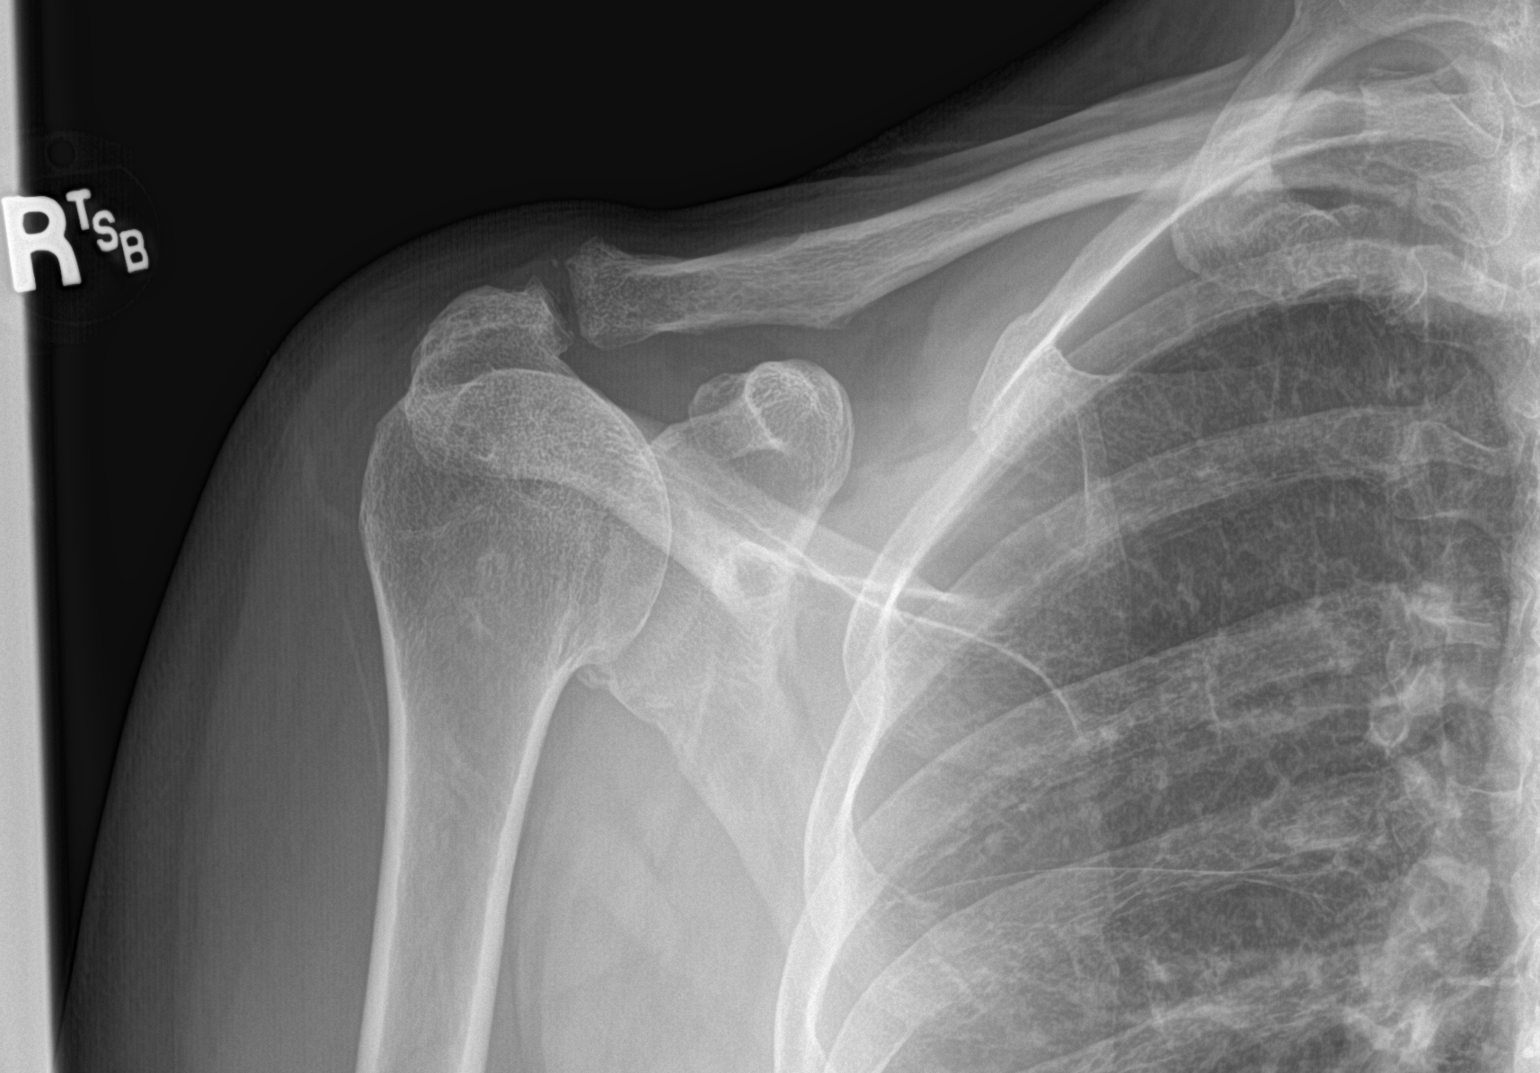

[2 of 2 positions shown; findings below may reference images not displayed]

FINDINGS: The right clavicle is intact without a fracture. Mild degenerative
changes at the right AC joint. Visualized right ribs are intact. No
gross abnormality to the right shoulder.
IMPRESSION: No acute bone abnormality.

## 2016-01-05 NOTE — Telephone Encounter (Signed)
Rx request to pharmacy/[facility]  

## 2016-02-26 ENCOUNTER — Other Ambulatory Visit: Payer: Self-pay | Admitting: Family Medicine

## 2016-02-26 NOTE — Telephone Encounter (Signed)
Last seen 09/05/15 and filled Meclizine 01/05/16 #30   Please advise    KP

## 2016-02-27 ENCOUNTER — Ambulatory Visit (INDEPENDENT_AMBULATORY_CARE_PROVIDER_SITE_OTHER): Payer: BLUE CROSS/BLUE SHIELD | Admitting: Family Medicine

## 2016-02-27 ENCOUNTER — Encounter: Payer: Self-pay | Admitting: Women's Health

## 2016-02-27 ENCOUNTER — Ambulatory Visit (INDEPENDENT_AMBULATORY_CARE_PROVIDER_SITE_OTHER): Payer: BLUE CROSS/BLUE SHIELD | Admitting: Women's Health

## 2016-02-27 ENCOUNTER — Encounter: Payer: Self-pay | Admitting: Family Medicine

## 2016-02-27 VITALS — BP 110/78 | Ht 61.0 in | Wt 126.4 lb

## 2016-02-27 DIAGNOSIS — S76312A Strain of muscle, fascia and tendon of the posterior muscle group at thigh level, left thigh, initial encounter: Secondary | ICD-10-CM

## 2016-02-27 DIAGNOSIS — Z01419 Encounter for gynecological examination (general) (routine) without abnormal findings: Secondary | ICD-10-CM | POA: Diagnosis not present

## 2016-02-27 MED ORDER — MELOXICAM 15 MG PO TABS: 15.0000 mg | ORAL_TABLET | Freq: Every day | ORAL | 2 refills | Status: DC

## 2016-02-27 NOTE — Progress Notes (Signed)
Georgana Curio May 01, 1961 [id number]    History:    Presents for annual exam.  Postmenopausal/no bleeding/no HRT. Occasional hot flushes that are manageable. History of hypothyroid currently on no medication with stable TSH at primary care.  Normal Pap and mammogram history. 12/2013 negative colonoscopy. Dealing with a left leg hamstring injury  has follow-up scheduled today. Avid runner unable to run, limping with walking.  Past medical history, past surgical history, family history and social history were all reviewed and documented in the EPIC chart. 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 Both daughters Joellen [place] and Colletta [place] type 1 diabetes, Colletta [place] celiac disease and neuromuscular disease, doing online classes.  ROS:  A ROS was performed and pertinent positives and negatives are included.  Exam:  Vitals:   02/27/16 0945  BP: 110/78  Weight: 126 lb 6.4 oz (57.3 kg)  Height: 5\' 1"  (1.549 m)   Body mass index is 23.88 kg/m.   General appearance:  Normal Thyroid:  Symmetrical, normal in size, without palpable masses or nodularity. Respiratory  Auscultation:  Clear without wheezing or rhonchi Cardiovascular  Auscultation:  Regular rate, without rubs, murmurs or gallops  Edema/varicosities:  Not grossly evident Abdominal  Soft,nontender, without masses, guarding or rebound.  Liver/spleen:  No organomegaly noted  Hernia:  None appreciated  Skin  Inspection:  Grossly normal   Breasts: Examined lying and sitting.     Right: Without masses, retractions, discharge or axillary adenopathy.     Left: Without masses, retractions, discharge or axillary adenopathy. Gentitourinary   Inguinal/mons:  Normal without inguinal adenopathy  External genitalia:  Normal  BUS/Urethra/Skene's glands:  Normal  Vagina:  Normal  Cervix:  Normal  Uterus:   normal in size, shape and contour.  Midline and mobile  Adnexa/parametria:     Rt: Without masses or tenderness.   Lt: Without masses or  tenderness.  Anus and perineum: Normal  Digital rectal exam: Normal sphincter tone without palpated masses or tenderness  Assessment/Plan:  55 y.o. M WF G2 P2  for annual exam.    Postmenopausal/no HRT/no bleeding History of hypothyroid normal TSH at primary care/ managing Vertigo no recent episodes Left leg hamstring injury follow-up scheduled Labs-primary care  Plan: SBE's, annual screening 3-D mammogram encouraged history of dense breasts. Continue exercise after hamstring injury healed, avid runner. Continue vitamin D supplement, calcium rich diet. UA, Pap normal with negative HR HPV typing, new screening guidelines reviewed.     Huel Cote [facility], 10:22 AM 02/27/2016

## 2016-02-27 NOTE — Patient Instructions (Signed)
You have a proximal hamstring strain. Consider compression shorts when up and walking around for next 6 weeks. Meloxicam 15mg  daily with food for pain and inflammation. Ice 15 minutes at a time 3-4 times a day. Wait 3-7 days then start leg curls, hamstring swings, running lunges - add 2 pound weight with time if these are too easy. 3 sets of 10 once or twice a day. Consider physical therapy as well in the future. Follow up with me in 4 weeks. I wouldn't run in the next 4 weeks. Ok to go back to walking the dogs after 1-2 weeks if you feel like you can.

## 2016-02-27 NOTE — Patient Instructions (Signed)

## 2016-03-02 DIAGNOSIS — S76312A Strain of muscle, fascia and tendon of the posterior muscle group at thigh level, left thigh, initial encounter: Secondary | ICD-10-CM | POA: Insufficient documentation

## 2016-03-02 NOTE — Progress Notes (Signed)
PCP: Ann Held, DO  Subjective:   HPI: Patient is a 55 y.o. female here for left hamstring injury.  Patient is an avid runner - usually 4 miles three times a week. She reports she fell down with her dog about 2 weeks ago - would get twinges in left proximal hamstring for the week after this. Tried stretching but felt worse with this. Then went jogging 2 days ago and the leg gave out because of this. No swelling or bruising. No numbness or tingling. Pain is 3/10 level, dull.  Past Medical History:  Diagnosis Date  . Hyperlipidemia   . 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 Pollen allergies   . Thyroid disease 2008   HYPOTHYROIDISM    Current Outpatient Prescriptions on File Prior to Visit  Medication Sig Dispense Refill  . cetirizine (ZYRTEC) 10 MG tablet Take 10 mg by mouth daily.    . Cholecalciferol (VITAMIN D) 1000 UNITS capsule Take 1,000 Units by mouth daily.      . Cyanocobalamin (B-12) 2500 MCG TABS Take 1 tablet by mouth daily.    . fluticasone (FLONASE) 50 MCG/ACT nasal spray USE TWO SPRAY(S) IN EACH NOSTRIL ONCE DAILY 16 g 6  . levothyroxine (SYNTHROID, LEVOTHROID) 75 MCG tablet TAKE ONE TABLET BY MOUTH ONCE DAILY BEFORE BREAKFAST 30 tablet 5  . meclizine (ANTIVERT) 25 MG tablet TAKE ONE TABLET BY MOUTH THREE TIMES DAILY AS NEEDED FOR DIZZINESS 30 tablet 0  . Multiple Vitamin (MULTIVITAMIN) tablet Take 1 tablet by mouth daily.       No current facility-administered medications on file prior to visit.     Past Surgical History:  Procedure Laterality Date  . CESAREAN SECTION  1992  . POPLITEAL SYNOVIAL CYST EXCISION Left 1974    Allergies  Allergen Reactions  . Other     TREES, POLLEN, DUST, MOLD AND ANIMALS    Social History   Social History  . Marital status: Married    Spouse name: N/A  . Number of children: 2  . 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 Smoking status: Never  Smoker  . Smokeless tobacco: Never Used  . Alcohol use No  . Drug use: No  . Sexual activity: Yes    Partners: Male    Birth control/ protection: Condom   Other Topics Concern  . Not on file   Social History Narrative  . No narrative on file    Family History  Problem Relation Age of Onset  . Cancer Father 81    prostate  . Diabetes Daughter   . Celiac disease Daughter   . Anuerysm Maternal Grandfather   . Diabetes Daughter   . Prostate cancer    . Colon cancer Neg Hx     BP 130/88   Pulse 83   Ht 5\' 1"  (1.549 m)   Wt 125 lb (56.7 kg)   LMP 07/02/2011   BMI 23.62 kg/m   Review of Systems: See HPI above.    Objective:  Physical Exam:  Gen: NAD, comfortable in exam room  Left leg: No gross deformity, swelling, bruising. TTP proximal medial hamstring near insertion.  No other tenderness of hip, buttock, low back, leg. FROM hip and knee without pain.  Pain on resisted knee flexion at 30 degrees, weakness - reproduces pain. NVI distally.  Right leg: FROM with 5/5 strength hamstring testing.    Assessment & Plan:  1. Left hamstring strain - proximal.  Shown home exercises to do daily.  Icing, meloxicam, compression.  Cross training, avoid running for next 4 weeks.  F/u in 4 weeks for reevaluation.

## 2016-03-02 NOTE — Assessment & Plan Note (Signed)
proximal.  Shown home exercises to do daily.  Icing, meloxicam, compression.  Cross training, avoid running for next 4 weeks.  F/u in 4 weeks for reevaluation.

## 2016-03-07 IMAGING — US US CHEST/MEDIASTINUM
1 series · 14 of 16 positions shown · non-contrast
Comparison: None.

CLINICAL DATA: C/o palpable hard painless lump over medial end of
Rt clavicle x 2 days, patient had an xray of clavicle to eval.

EXAM:
CHEST ULTRASOUND

[Series 1: us chest/mediastinum · 0.02mm/px · 14 of 17 slices shown]
[im 1/17]
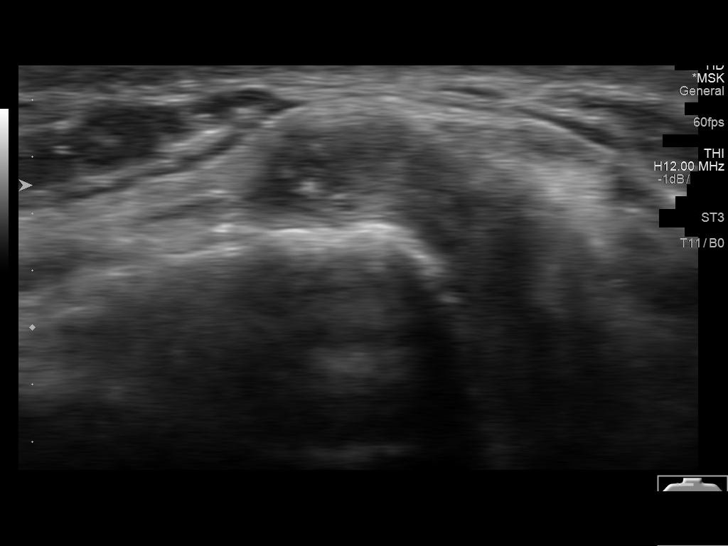
[im 2/17]
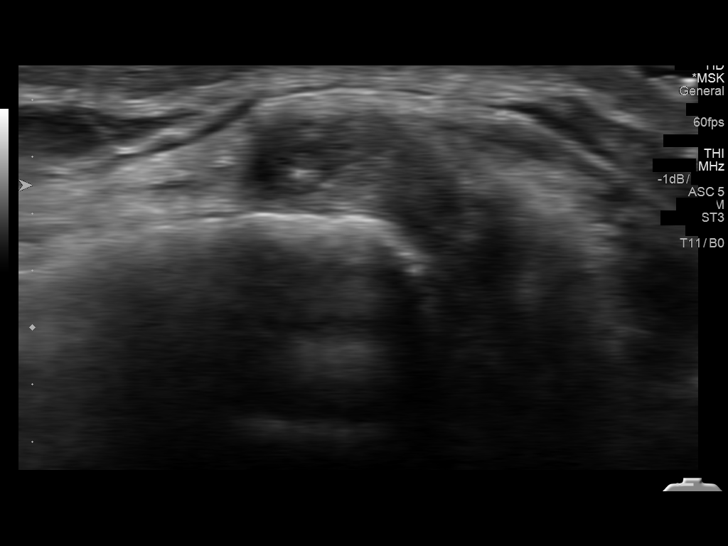
[im 3/17]
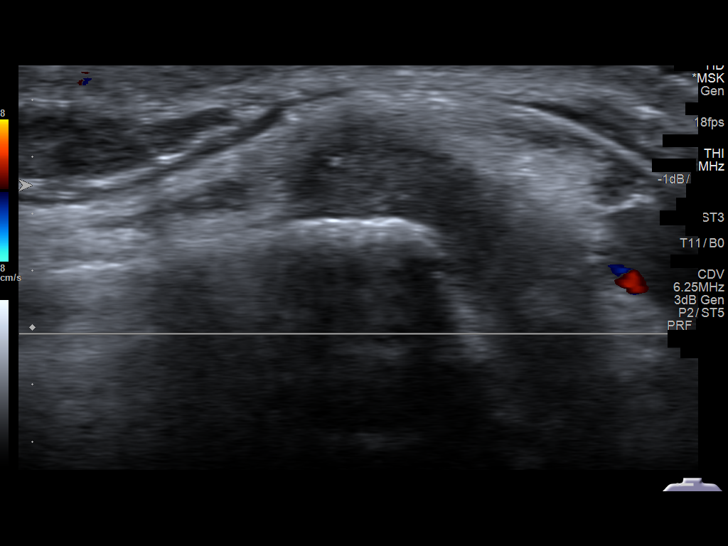
[im 5/17]
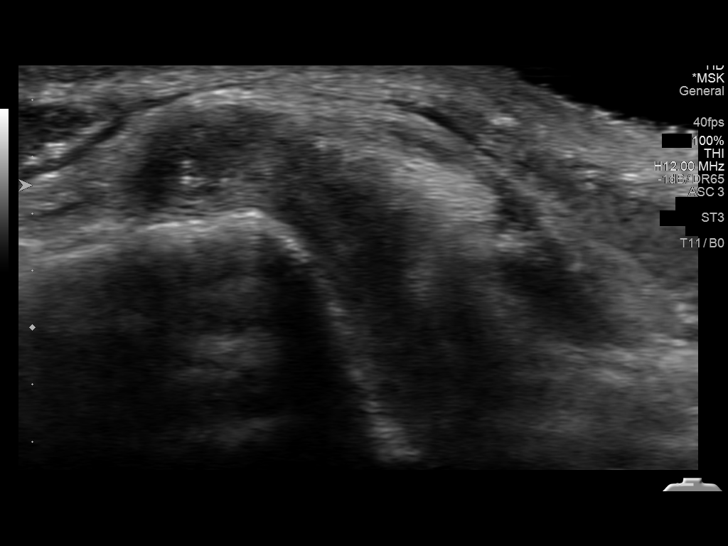
[im 6/17]
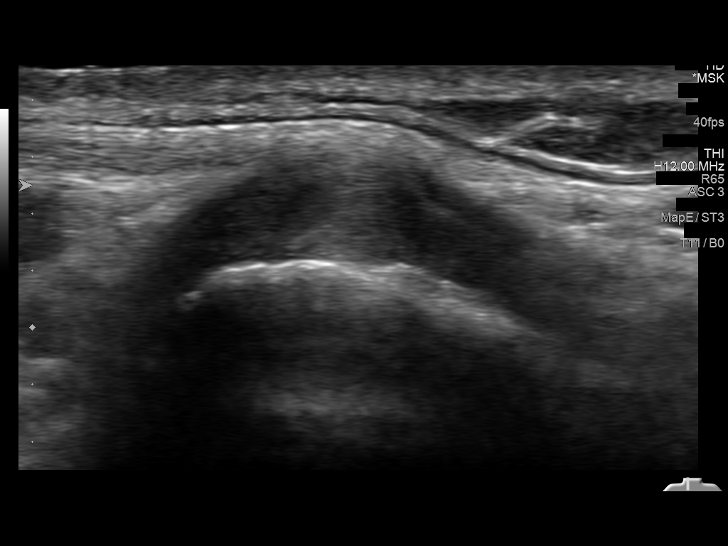
[im 7/17]
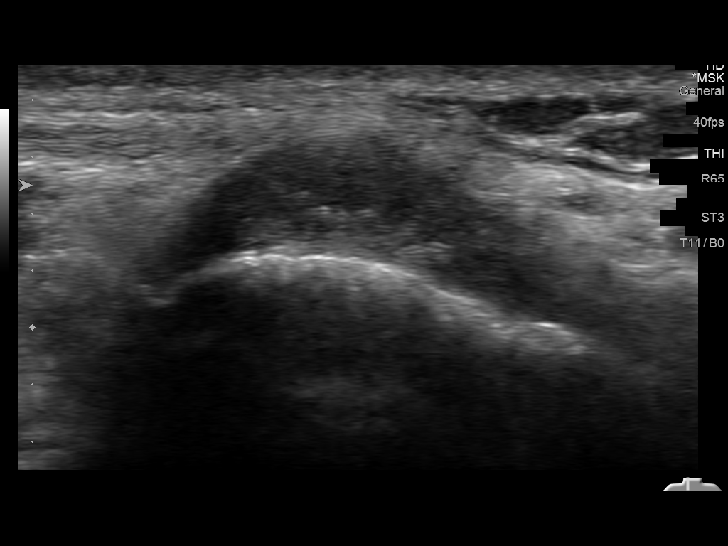
[im 8/17]
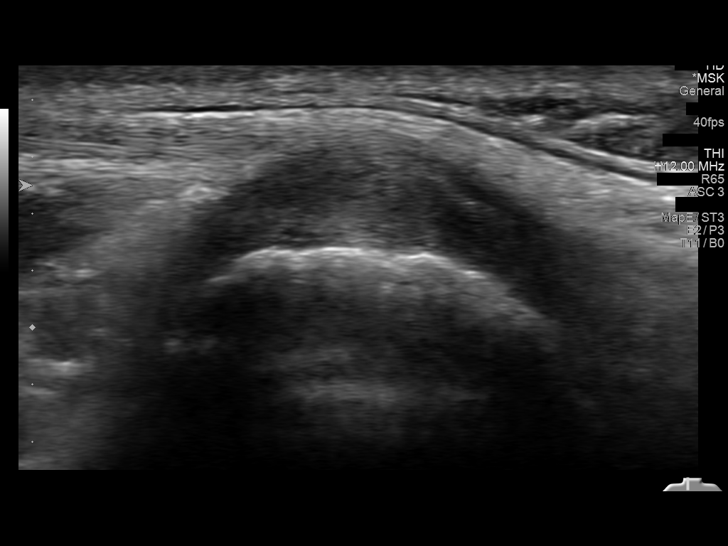
[im 9/17]
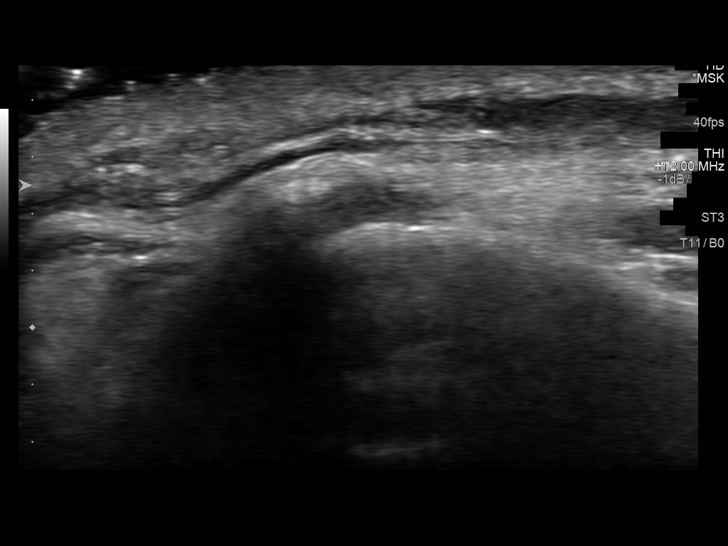
[im 10/17]
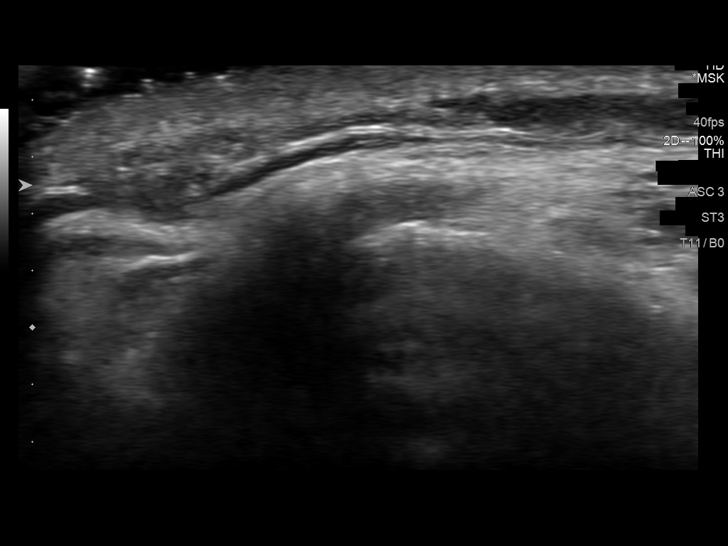
[im 11/17]
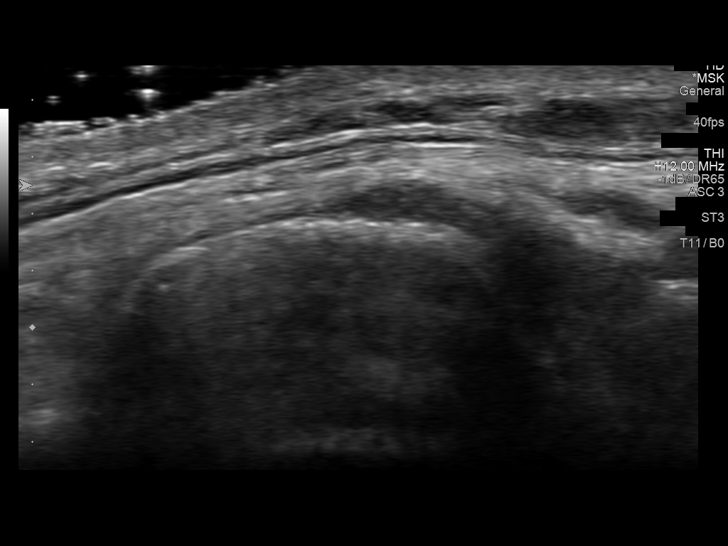
[im 13/17]
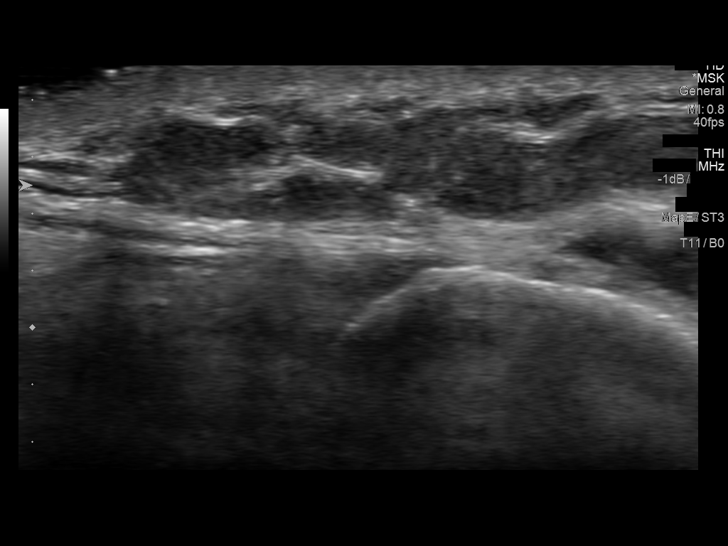
[im 14/17]
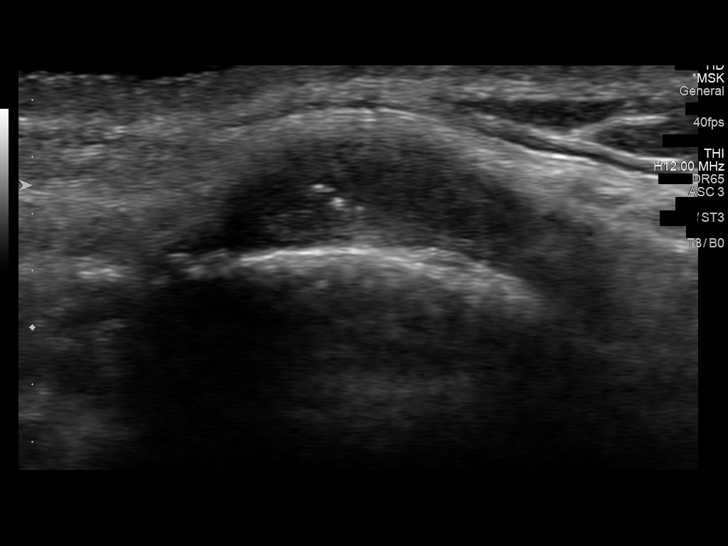
[im 15/17]
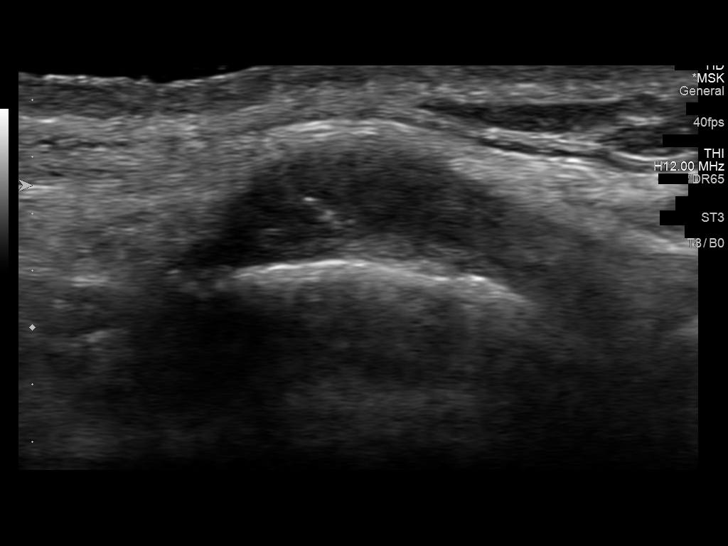
[im 17/17]
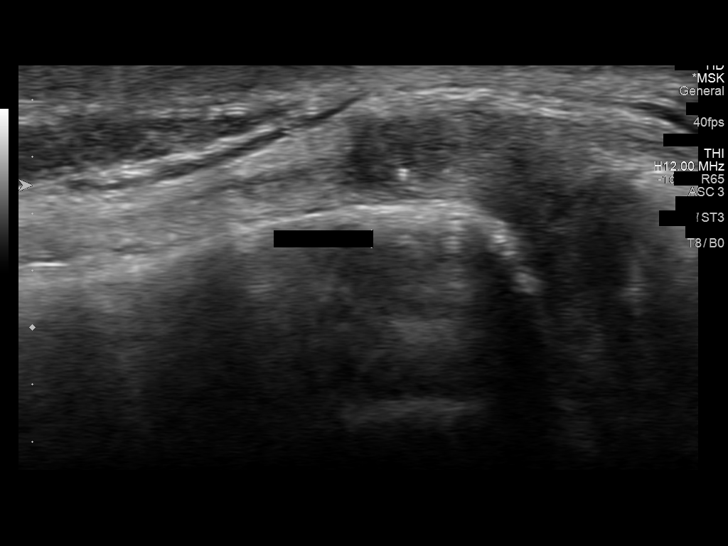

[14 of 16 positions shown; findings below may reference images not displayed]

FINDINGS: There is hypo echogenicity surrounding the medial margin of the
right clavicle extending to the sternoclavicular joint. This is
likely capsular hypertrophy. There may be a joint effusion.
Echogenic foci are seen within this. This is asymmetric when
compared to the left side.
IMPRESSION: Abnormality surrounding the right clavicular head and right
sternoclavicular joint. This is likely either capsular hypertrophy
or capsular distention from a joint effusion. If further imaging is
desired clinically, this would be best performed with MRI or CT. MRI
would be the most sensitive and specific modality.

## 2016-03-26 ENCOUNTER — Encounter: Payer: Self-pay | Admitting: Family Medicine

## 2016-03-26 ENCOUNTER — Ambulatory Visit (INDEPENDENT_AMBULATORY_CARE_PROVIDER_SITE_OTHER): Payer: BLUE CROSS/BLUE SHIELD | Admitting: Family Medicine

## 2016-03-26 DIAGNOSIS — S76312D Strain of muscle, fascia and tendon of the posterior muscle group at thigh level, left thigh, subsequent encounter: Secondary | ICD-10-CM

## 2016-03-26 NOTE — Patient Instructions (Signed)
Your hamstring is much better and only a little bit weak. I'd still do the curls and swings with a 2 pound ankle weight now 3 sets of 10 once a day. You have piriformis syndrome Consider tennis ball to massage area when sitting Pick 2-3 stretches where you feel the pull in the area of pain - do 3 of these and hold for 20-30 seconds at least once a day Ibuprofen 600mg  three times a day with food OR aleve 2 tabs twice a day with food for pain and inflammation only if needed. Side leg raises, standing hip rotations 3 sets of 10 once a day.  Can also add ankle weight for these Consider physical therapy Follow up with me in 1 month. Ok to try the walk:jog program as we discussed and increase every other day.

## 2016-03-30 NOTE — Progress Notes (Signed)
PCP: Ann Held, DO  Subjective:   HPI: Patient is a 55 y.o. female here for left hamstring injury.  9/22: Patient is an avid runner - usually 4 miles three times a week. She reports she fell down with her dog about 2 weeks ago - would get twinges in left proximal hamstring for the week after this. Tried stretching but felt worse with this. Then went jogging 2 days ago and the leg gave out because of this. No swelling or bruising. No numbness or tingling. Pain is 3/10 level, dull.  10/20: Patient returns noting she still has a little soreness but more proximal now. Pain is 2/10, dull. Has been doing home exercises, taking mobic. No swelling. Worse with prolonged sitting. No skin changes, numbness.  Past Medical History:  Diagnosis Date  . Hyperlipidemia   . 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 Pollen allergies   . Thyroid disease 2008   HYPOTHYROIDISM    Current Outpatient Prescriptions on File Prior to Visit  Medication Sig Dispense Refill  . cetirizine (ZYRTEC) 10 MG tablet Take 10 mg by mouth daily.    . Cholecalciferol (VITAMIN D) 1000 UNITS capsule Take 1,000 Units by mouth daily.      . Cyanocobalamin (B-12) 2500 MCG TABS Take 1 tablet by mouth daily.    . fluticasone (FLONASE) 50 MCG/ACT nasal spray USE TWO SPRAY(S) IN EACH NOSTRIL ONCE DAILY 16 g 6  . levothyroxine (SYNTHROID, LEVOTHROID) 75 MCG tablet TAKE ONE TABLET BY MOUTH ONCE DAILY BEFORE BREAKFAST 30 tablet 5  . meclizine (ANTIVERT) 25 MG tablet TAKE ONE TABLET BY MOUTH THREE TIMES DAILY AS NEEDED FOR DIZZINESS 30 tablet 0  . meloxicam (MOBIC) 15 MG tablet Take 1 tablet (15 mg total) by mouth daily. 30 tablet 2  . Multiple Vitamin (MULTIVITAMIN) tablet Take 1 tablet by mouth daily.       No current facility-administered medications on file prior to visit.     Past Surgical History:  Procedure Laterality Date  . CESAREAN SECTION  1992  . POPLITEAL SYNOVIAL CYST EXCISION Left 1974    Allergies  Allergen  Reactions  . Other     TREES, POLLEN, DUST, MOLD AND ANIMALS    Social History   Social History  . Marital status: Married    Spouse name: N/A  . Number of children: 2  . 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 Smoking status: Never Smoker  . Smokeless tobacco: Never Used  . Alcohol use No  . Drug use: No  . Sexual activity: Yes    Partners: Male    Birth control/ protection: Condom   Other Topics Concern  . Not on file   Social History Narrative  . No narrative on file    Family History  Problem Relation Age of Onset  . Cancer Father 35    prostate  . Diabetes Daughter   . Celiac disease Daughter   . Anuerysm Maternal Grandfather   . Diabetes Daughter   . Prostate cancer    . Colon cancer Neg Hx     BP 104/74   Pulse 82   Ht 5\' 2"  (1.575 m)   Wt 125 lb (56.7 kg)   LMP 07/02/2011   BMI 22.86 kg/m   Review of Systems: See HPI above.    Objective:  Physical Exam:  Gen: NAD, comfortable in exam room  Left leg: No gross  deformity, swelling, bruising. No TTP hamstring medially now.  Mild TTP piriformis.  No other tenderness of hip, buttock, low back, leg. FROM hip and knee without pain.  5-/5 strength knee flexion at 30 degrees on left.  No other pain or weakness. NVI distally.  Right leg: FROM with 5/5 strength hamstring testing.    Assessment & Plan:  1. Left hamstring strain - proximal.  This has improved with home exercises, mobic.  Now dealing primarily with piriformis syndrome.  Shown home exercises and stretches to do for this.  F/u in 1 month.  Discussed walk/jog program.

## 2016-03-30 NOTE — Assessment & Plan Note (Signed)
proximal.  This has improved with home exercises, mobic.  Now dealing primarily with piriformis syndrome.  Shown home exercises and stretches to do for this.  F/u in 1 month.  Discussed walk/jog program.

## 2016-04-26 ENCOUNTER — Other Ambulatory Visit: Payer: Self-pay | Admitting: Family

## 2016-04-26 NOTE — Telephone Encounter (Signed)
Left message on VM @ [id number] for patient to call and schedule FU prior to future refills

## 2016-04-26 NOTE — Telephone Encounter (Signed)
Refill sent per [facility] refill protocol/SLS  

## 2016-04-26 NOTE — Telephone Encounter (Signed)
eScribe request from [facility] for refill on Levothyroxine Last filled - 10/29/15, #30x5 Last AEX - 09/05/15 Next AEX - 6-Mths. Requested drug refills are authorized, however, the patient needs further evaluation and/or laboratory testing before further refills are given. Ask her to make an appointment for this.  Please call patient and schedule F/U ROV with labs prior to future refill authorizations/SLSL 11/20

## 2016-05-06 NOTE — Telephone Encounter (Signed)
Medication follow up appointment scheduled for 06/18/16

## 2016-06-18 ENCOUNTER — Encounter: Payer: Self-pay | Admitting: Family Medicine

## 2016-06-18 ENCOUNTER — Ambulatory Visit (INDEPENDENT_AMBULATORY_CARE_PROVIDER_SITE_OTHER): Payer: BLUE CROSS/BLUE SHIELD | Admitting: Family Medicine

## 2016-06-18 VITALS — BP 98/78 | HR 81 | Temp 98.3°F | Resp 16 | Ht 62.4 in | Wt 130.6 lb

## 2016-06-18 DIAGNOSIS — H8110 Benign paroxysmal vertigo, unspecified ear: Secondary | ICD-10-CM

## 2016-06-18 DIAGNOSIS — E039 Hypothyroidism, unspecified: Secondary | ICD-10-CM

## 2016-06-18 DIAGNOSIS — Z114 Encounter for screening for human immunodeficiency virus [HIV]: Secondary | ICD-10-CM | POA: Diagnosis not present

## 2016-06-18 LAB — TSH: TSH: 1.42 u[IU]/mL (ref 0.35–4.50)

## 2016-06-18 MED ORDER — MECLIZINE HCL 25 MG PO TABS: ORAL_TABLET | ORAL | 0 refills | Status: DC

## 2016-06-18 NOTE — Patient Instructions (Signed)
Hypothyroidism Hypothyroidism is a disorder of the thyroid. The thyroid is a large gland that is located in the lower front of the neck. The thyroid releases hormones that control how the body works. With hypothyroidism, the thyroid does not make enough of these hormones. What are the causes? Causes of hypothyroidism may include:  Viral infections.  Pregnancy.  Your own defense system (immune system) attacking your thyroid.  Certain medicines.  Birth defects.  Past radiation treatments to your head or neck.  Past treatment with radioactive iodine.  Past surgical removal of part or all of your thyroid.  Problems with the gland that is located in the center of your brain (pituitary).  What are the signs or symptoms? Signs and symptoms of hypothyroidism may include:  Feeling as though you have no energy (lethargy).  Inability to tolerate cold.  Weight gain that is not explained by a change in diet or exercise habits.  Dry skin.  Coarse hair.  Menstrual irregularity.  Slowing of thought processes.  Constipation.  Sadness or depression.  How is this diagnosed? Your health care provider may diagnose hypothyroidism with blood tests and ultrasound tests. How is this treated? Hypothyroidism is treated with medicine that replaces the hormones that your body does not make. After you begin treatment, it may take several weeks for symptoms to go away. Follow these instructions at home:  Take medicines only as directed by your health care provider.  If you start taking any new medicines, tell your health care provider.  Keep all follow-up visits as directed by your health care provider. This is important. As your condition improves, your dosage needs may change. You will need to have blood tests regularly so that your health care provider can watch your condition. Contact a health care provider if:  Your symptoms do not get better with treatment.  You are taking thyroid  replacement medicine and: ? You sweat excessively. ? You have tremors. ? You feel anxious. ? You lose weight rapidly. ? You cannot tolerate heat. ? You have emotional swings. ? You have diarrhea. ? You feel weak. Get help right away if:  You develop chest pain.  You develop an irregular heartbeat.  You develop a rapid heartbeat. This information is not intended to replace advice given to you by your health care provider. Make sure you discuss any questions you have with your health care provider. Document Released: 05/24/2005 Document Revised: 10/30/2015 Document Reviewed: 10/09/2013 Elsevier Interactive Patient Education  2017 Elsevier Inc.  

## 2016-06-18 NOTE — Progress Notes (Signed)
Patient ID: Stacey [name], female    DOB: 19-Sep-1960  Age: 56 y.o. MRN: [id number]    Subjective:  Subjective  HPI Nayvee Ginder presents for f/u thyroid and would like meclizine refilled--- she has trouble with vertigo at high altitudes and she is going to [place] on vacation.    Review of Systems  Constitutional: Negative for appetite change, diaphoresis, fatigue and unexpected weight change.  Eyes: Negative for pain, redness and visual disturbance.  Respiratory: Negative for cough, chest tightness, shortness of breath and wheezing.   Cardiovascular: Negative for chest pain, palpitations and leg swelling.  Endocrine: Negative for cold intolerance, heat intolerance, polydipsia, polyphagia and polyuria.  Genitourinary: Negative for difficulty urinating, dysuria and frequency.  Neurological: Negative for dizziness, light-headedness, numbness and headaches.    History Past Medical History:  Diagnosis Date  . Hyperlipidemia   . 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 Pollen allergies   . Thyroid disease 2008   HYPOTHYROIDISM    She has a past surgical history that includes Cesarean section (1992) and Popliteal synovial cyst excision (Left, 1974).   Her family history includes Anuerysm in her maternal grandfather; Cancer (age of onset: 52) in her father; Celiac disease in her daughter; Diabetes in her daughter and daughter.She reports that she has never smoked. She has never used smokeless tobacco. She reports that she does not drink alcohol or use drugs.  Current Outpatient Prescriptions on File Prior to Visit  Medication Sig Dispense Refill  . cetirizine (ZYRTEC) 10 MG tablet Take 10 mg by mouth daily.    . Cholecalciferol (VITAMIN D) 1000 UNITS capsule Take 1,000 Units by mouth daily.      . Cyanocobalamin (B-12) 2500 MCG TABS Take 1 tablet by mouth daily.    . fluticasone (FLONASE) 50 MCG/ACT nasal spray USE TWO SPRAY(S) IN EACH NOSTRIL ONCE DAILY 16 g 6  . levothyroxine (SYNTHROID, LEVOTHROID) 75  MCG tablet TAKE ONE TABLET BY MOUTH BEFORE BREAKFAST 90 tablet 0  . Multiple Vitamin (MULTIVITAMIN) tablet Take 1 tablet by mouth daily.       No current facility-administered medications on file prior to visit.      Objective:  Objective  Physical Exam  Constitutional: She is oriented to person, place, and time. She appears well-developed and well-nourished.  HENT:  Head: Normocephalic and atraumatic.  Eyes: Conjunctivae and EOM are normal.  Neck: Normal range of motion. Neck supple. No JVD present. Carotid bruit is not present. No thyromegaly present.  Cardiovascular: Normal rate, regular rhythm and normal heart sounds.   No murmur heard. Pulmonary/Chest: Effort normal and breath sounds normal. No respiratory distress. She has no wheezes. She has no rales. She exhibits no tenderness.  Musculoskeletal: She exhibits no edema.  Neurological: She is alert and oriented to person, place, and time.  Psychiatric: She has a normal mood and affect. Her behavior is normal. Judgment and thought content normal.  Nursing note and vitals reviewed.  BP 98/78 (BP Location: Right Arm, Cuff Size: Normal)   Pulse 81   Temp 98.3 F (36.8 C) (Oral)   Resp 16   Ht 5' 2.4" (1.585 m)   Wt 130 lb 9.6 oz (59.2 kg)   LMP 07/02/2011   SpO2 96%   BMI 23.58 kg/m  Wt Readings from Last 3 Encounters:  06/18/16 130 lb 9.6 oz (59.2 kg)  03/26/16 125 lb (56.7 kg)  02/27/16 125 lb (56.7 kg)     Lab Results  Component Value Date   WBC 5.3 01/30/2015  HGB 14.6 01/30/2015   HCT 43.4 01/30/2015   PLT 234 01/30/2015   GLUCOSE 100 (H) 09/05/2015   CHOL 230 (H) 12/13/2014   TRIG 89.0 12/13/2014   HDL 66.50 12/13/2014   LDLCALC 146 (H) 12/13/2014   ALT 16 12/13/2014   AST 23 12/13/2014   NA 138 09/05/2015   K 4.0 09/05/2015   CL 103 09/05/2015   CREATININE 0.71 09/05/2015   BUN 14 09/05/2015   CO2 32 09/05/2015   TSH 1.42 06/18/2016   HGBA1C 6.0 09/05/2015   MICROALBUR 0.7 12/13/2014    [place]  Chest  Result Date: 05/29/2015 CLINICAL DATA:  C/o palpable hard painless lump over medial end of Rt clavicle x 2 days, patient had an xray of clavicle to eval. EXAM: CHEST ULTRASOUND COMPARISON:  None. FINDINGS: There is hypo echogenicity surrounding the medial margin of the right clavicle extending to the sternoclavicular joint. This is likely capsular hypertrophy. There may be a joint effusion. Echogenic foci are seen within this. This is asymmetric when compared to the left side. IMPRESSION: Abnormality surrounding the right clavicular head and right sternoclavicular joint. This is likely either capsular hypertrophy or capsular distention from a joint effusion. If further imaging is desired clinically, this would be best performed with MRI or CT. MRI would be the most sensitive and specific modality. Electronically Signed   By: Lajean Manes M.D.   On: 05/29/2015 16:00     Assessment & Plan:  Plan  I have discontinued Ms. Hedden's meloxicam. I am also having her maintain her Vitamin D, multivitamin, cetirizine, B-12, fluticasone, levothyroxine, and meclizine.  Meds ordered this encounter  Medications  . meclizine (ANTIVERT) 25 MG tablet    Sig: TAKE ONE TABLET BY MOUTH THREE TIMES DAILY AS NEEDED FOR DIZZINESS    Dispense:  30 tablet    Refill:  0    Problem List Items Addressed This Visit      Unprioritized   Benign paroxysmal positional vertigo - Primary   Relevant Medications   meclizine (ANTIVERT) 25 MG tablet   Hypothyroidism    Cont synthroid Check labs      Relevant Orders   TSH (Completed)      Follow-up: Return for annual exam, fasting.  Ann Held, DO

## 2016-06-18 NOTE — Progress Notes (Signed)
Pre visit review using our clinic review tool, if applicable. No additional management support is needed unless otherwise documented below in the visit note. 

## 2016-06-20 NOTE — Assessment & Plan Note (Signed)
Cont synthroid Check labs  

## 2016-07-22 ENCOUNTER — Other Ambulatory Visit: Payer: Self-pay | Admitting: *Deleted

## 2016-07-22 MED ORDER — LEVOTHYROXINE SODIUM 75 MCG PO TABS: ORAL_TABLET | ORAL | 0 refills | Status: DC

## 2016-09-08 LAB — HM MAMMOGRAPHY

## 2016-09-17 ENCOUNTER — Encounter: Payer: Self-pay | Admitting: *Deleted

## 2016-10-18 ENCOUNTER — Other Ambulatory Visit: Payer: Self-pay | Admitting: Family Medicine

## 2016-11-06 ENCOUNTER — Other Ambulatory Visit: Payer: Self-pay | Admitting: Family Medicine

## 2016-11-06 DIAGNOSIS — H8110 Benign paroxysmal vertigo, unspecified ear: Secondary | ICD-10-CM

## 2017-01-06 ENCOUNTER — Other Ambulatory Visit: Payer: Self-pay | Admitting: Family Medicine

## 2017-01-06 DIAGNOSIS — H8110 Benign paroxysmal vertigo, unspecified ear: Secondary | ICD-10-CM

## 2017-01-09 ENCOUNTER — Other Ambulatory Visit: Payer: Self-pay | Admitting: Family Medicine

## 2017-01-15 ENCOUNTER — Other Ambulatory Visit: Payer: Self-pay | Admitting: Family Medicine

## 2017-01-17 NOTE — Telephone Encounter (Signed)
Patient need to make appointment for an office visit to continue to request for refill. Patient's ov and lab is 06/18/16. [facility]

## 2017-01-20 ENCOUNTER — Encounter: Payer: Self-pay | Admitting: Family Medicine

## 2017-01-20 ENCOUNTER — Ambulatory Visit (INDEPENDENT_AMBULATORY_CARE_PROVIDER_SITE_OTHER): Payer: BLUE CROSS/BLUE SHIELD | Admitting: Family Medicine

## 2017-01-20 VITALS — BP 98/62 | HR 63 | Temp 97.7°F | Ht 61.5 in | Wt 119.4 lb

## 2017-01-20 DIAGNOSIS — D171 Benign lipomatous neoplasm of skin and subcutaneous tissue of trunk: Secondary | ICD-10-CM | POA: Diagnosis not present

## 2017-01-20 NOTE — Patient Instructions (Signed)
Lipoma A lipoma is a noncancerous (benign) tumor that is made up of fat cells. This is a very common type of soft-tissue growth. Lipomas are usually found under the skin (subcutaneous). They may occur in any tissue of the body that contains fat. Common areas for lipomas to appear include the back, shoulders, buttocks, and thighs. Lipomas grow slowly, and they are usually painless. Most lipomas do not cause problems and do not require treatment. What are the causes? The cause of this condition is not known. What increases the risk? This condition is more likely to develop in:  People who are 40-60 years old.  People who have a family history of lipomas.  What are the signs or symptoms? A lipoma usually appears as a small, round bump under the skin. It may feel soft or rubbery, but the firmness can vary. Most lipomas are not painful. However, a lipoma may become painful if it is located in an area where it pushes on nerves. How is this diagnosed? A lipoma can usually be diagnosed with a physical exam. You may also have tests to confirm the diagnosis and to rule out other conditions. Tests may include:  Imaging tests, such as a CT scan or MRI.  Removal of a tissue sample to be looked at under a microscope (biopsy).  How is this treated? Treatment is not needed for small lipomas that are not causing problems. If a lipoma continues to get bigger or it causes problems, removal is often the best option. Lipomas can also be removed to improve appearance. Removal of a lipoma is usually done with a surgery in which the fatty cells and the surrounding capsule are removed. Most often, a medicine that numbs the area (local anesthetic) is used for this procedure. Follow these instructions at home:  Keep all follow-up visits as directed by your health care provider. This is important. Contact a health care provider if:  Your lipoma becomes larger or hard.  Your lipoma becomes painful, red, or  increasingly swollen. These could be signs of infection or a more serious condition. This information is not intended to replace advice given to you by your health care provider. Make sure you discuss any questions you have with your health care provider. Document Released: 05/14/2002 Document Revised: 10/30/2015 Document Reviewed: 05/20/2014 Elsevier Interactive Patient Education  2018 Elsevier Inc.  

## 2017-01-20 NOTE — Progress Notes (Signed)
Patient ID: Stacey [name], female    DOB: 02/28/1961  Age: 56 y.o. MRN: [id number]    Subjective:  Subjective  HPI Stacey [name] presents for lipoma R side ribcage  She has lost weight and noticed it recently   Review of Systems  Constitutional: Negative for activity change, appetite change, fatigue and unexpected weight change.  Respiratory: Negative for cough and shortness of breath.   Cardiovascular: Negative for chest pain and palpitations.  Psychiatric/Behavioral: Negative for behavioral problems and dysphoric mood. The patient is not nervous/anxious.     History Past Medical History:  Diagnosis Date  . Hyperlipidemia   . 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 Pollen allergies   . Thyroid disease 2008   HYPOTHYROIDISM    She has a past surgical history that includes Cesarean section (1992) and Popliteal synovial cyst excision (Left, 1974).   Her family history includes Anuerysm in her maternal grandfather; Cancer (age of onset: 19) in her father; Celiac disease in her daughter; Diabetes in her daughter and daughter; Prostate cancer in her unknown relative.She reports that she has never smoked. She has never used smokeless tobacco. She reports that she does not drink alcohol or use drugs.  Current Outpatient Prescriptions on File Prior to Visit  Medication Sig Dispense Refill  . cetirizine (ZYRTEC) 10 MG tablet Take 10 mg by mouth daily.    . Cholecalciferol (VITAMIN D) 1000 UNITS capsule Take 1,000 Units by mouth daily.      . Cyanocobalamin (B-12) 2500 MCG TABS Take 1 tablet by mouth daily.    . fluticasone (FLONASE) 50 MCG/ACT nasal spray USE TWO SPRAY(S) IN EACH NOSTRIL ONCE DAILY 16 g 6  . levothyroxine (SYNTHROID, LEVOTHROID) 75 MCG tablet TAKE ONE TABLET BY MOUTH BEFORE BREAKFAST 30 tablet 0  . meclizine (ANTIVERT) 25 MG tablet TAKE 1 TABLET BY MOUTH THREE TIMES DAILY AS NEEDED FOR DIZZINESS 30 tablet 0  . Multiple Vitamin (MULTIVITAMIN) tablet Take 1 tablet by mouth daily.       No  current facility-administered medications on file prior to visit.      Objective:  Objective  Physical Exam  Constitutional: She is oriented to person, place, and time. She appears well-developed and well-nourished.  HENT:  Head: Normocephalic and atraumatic.  Eyes: Conjunctivae and EOM are normal.  Neck: Normal range of motion. Neck supple. No JVD present. Carotid bruit is not present. No thyromegaly present.  Cardiovascular: Normal rate, regular rhythm and normal heart sounds.   No murmur heard. Pulmonary/Chest: Effort normal and breath sounds normal. No respiratory distress. She has no wheezes. She has no rales. She exhibits no tenderness.  Musculoskeletal: She exhibits no edema.  Neurological: She is alert and oriented to person, place, and time.  Skin:  Rubbery mass L side rib cage C/w lipoma  Psychiatric: She has a normal mood and affect.  Nursing note and vitals reviewed.  BP 98/62 (BP Location: Left Arm, Patient Position: Sitting, Cuff Size: Normal)   Pulse 63   Temp 97.7 F (36.5 C) (Oral)   Ht 5' 1.5" (1.562 m)   Wt 119 lb 6 oz (54.1 kg)   LMP 07/02/2011   SpO2 99%   BMI 22.19 kg/m  Wt Readings from Last 3 Encounters:  01/20/17 119 lb 6 oz (54.1 kg)  06/18/16 130 lb 9.6 oz (59.2 kg)  03/26/16 125 lb (56.7 kg)     Lab Results  Component Value Date   WBC 5.3 01/30/2015   HGB 14.6 01/30/2015   HCT 43.4 01/30/2015  PLT 234 01/30/2015   GLUCOSE 100 (H) 09/05/2015   CHOL 230 (H) 12/13/2014   TRIG 89.0 12/13/2014   HDL 66.50 12/13/2014   LDLCALC 146 (H) 12/13/2014   ALT 16 12/13/2014   AST 23 12/13/2014   NA 138 09/05/2015   K 4.0 09/05/2015   CL 103 09/05/2015   CREATININE 0.71 09/05/2015   BUN 14 09/05/2015   CO2 32 09/05/2015   TSH 1.42 06/18/2016   HGBA1C 6.0 09/05/2015   MICROALBUR 0.7 12/13/2014    [place] Chest  Result Date: 05/29/2015 CLINICAL DATA:  C/o palpable hard painless lump over medial end of Rt clavicle x 2 days, patient had an xray of  clavicle to eval. EXAM: CHEST ULTRASOUND COMPARISON:  None. FINDINGS: There is hypo echogenicity surrounding the medial margin of the right clavicle extending to the sternoclavicular joint. This is likely capsular hypertrophy. There may be a joint effusion. Echogenic foci are seen within this. This is asymmetric when compared to the left side. IMPRESSION: Abnormality surrounding the right clavicular head and right sternoclavicular joint. This is likely either capsular hypertrophy or capsular distention from a joint effusion. If further imaging is desired clinically, this would be best performed with MRI or CT. MRI would be the most sensitive and specific modality. Electronically Signed   By: Lajean Manes M.D.   On: 05/29/2015 16:00     Assessment & Plan:  Plan  I am having Ms. Splawn maintain her Vitamin D, multivitamin, cetirizine, B-12, meclizine, fluticasone, levothyroxine, Fish Oil, and ascorbic acid.  Meds ordered this encounter  Medications  . Omega-3 Fatty Acids (FISH OIL) 1000 MG CAPS    Sig: Take by mouth.  Marland Kitchen ascorbic acid (VITAMIN C) 250 MG CHEW    Sig: Chew 250 mg by mouth daily.    Problem List Items Addressed This Visit    None    Visit Diagnoses    Lipoma of torso    -  Primary    reassured pt---  If there is any change in size or if it becomes painful We can refer to surgery for evaluation  Follow-up: Return if symptoms worsen or fail to improve.  Ann Held, DO

## 2017-01-20 NOTE — Progress Notes (Signed)
Pre visit review using our clinic review tool, if applicable. No additional management support is needed unless otherwise documented below in the visit note. 

## 2017-01-25 ENCOUNTER — Encounter: Payer: Self-pay | Admitting: Family Medicine

## 2017-01-25 ENCOUNTER — Ambulatory Visit (INDEPENDENT_AMBULATORY_CARE_PROVIDER_SITE_OTHER): Payer: BLUE CROSS/BLUE SHIELD | Admitting: Family Medicine

## 2017-01-25 DIAGNOSIS — M25562 Pain in left knee: Secondary | ICD-10-CM

## 2017-01-25 NOTE — Patient Instructions (Signed)
Your exam and ultrasound are reassuring. You have a small bakers cyst and snapping hamstring syndrome. Do hamstring curls and swings 3 sets of 10 once a day until this resolves. No limitations on running, activities though. Icing only if needed. Tylenol, ibuprofen only if needed. Call me if problems persist beyond 4+ weeks otherwise f/u prn.

## 2017-01-26 DIAGNOSIS — M25562 Pain in left knee: Secondary | ICD-10-CM | POA: Insufficient documentation

## 2017-01-26 NOTE — Progress Notes (Signed)
PCP: Ann Held, DO  Subjective:   HPI: Patient is a 56 y.o. female here for left knee pain.  Patient reports about 2 weeks ago she started to get some stiffness and pain behind left knee. Associated with popping posteromedially that improves with exercise. Pain level 0/10 currently. No injury or trauma. No skin changes, numbness. When getting a massage the masseuse felt a lump behind patient's left knee. No catching, locking, giving out.  Past Medical History:  Diagnosis Date  . Hyperlipidemia   . 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 Pollen allergies   . Thyroid disease 2008   HYPOTHYROIDISM    Current Outpatient Prescriptions on File Prior to Visit  Medication Sig Dispense Refill  . ascorbic acid (VITAMIN C) 250 MG CHEW Chew 250 mg by mouth daily.    . cetirizine (ZYRTEC) 10 MG tablet Take 10 mg by mouth daily.    . Cholecalciferol (VITAMIN D) 1000 UNITS capsule Take 1,000 Units by mouth daily.      . Cyanocobalamin (B-12) 2500 MCG TABS Take 1 tablet by mouth daily.    . fluticasone (FLONASE) 50 MCG/ACT nasal spray USE TWO SPRAY(S) IN EACH NOSTRIL ONCE DAILY 16 g 6  . levothyroxine (SYNTHROID, LEVOTHROID) 75 MCG tablet TAKE ONE TABLET BY MOUTH BEFORE BREAKFAST 30 tablet 0  . meclizine (ANTIVERT) 25 MG tablet TAKE 1 TABLET BY MOUTH THREE TIMES DAILY AS NEEDED FOR DIZZINESS 30 tablet 0  . Multiple Vitamin (MULTIVITAMIN) tablet Take 1 tablet by mouth daily.      . Omega-3 Fatty Acids (FISH OIL) 1000 MG CAPS Take by mouth.     No current facility-administered medications on file prior to visit.     Past Surgical History:  Procedure Laterality Date  . CESAREAN SECTION  1992  . POPLITEAL SYNOVIAL CYST EXCISION Left 1974    Allergies  Allergen Reactions  . Other     TREES, POLLEN, DUST, MOLD AND ANIMALS    Social History   Social History  . Marital status: Married    Spouse name: N/A  . Number of children: 2  . Years of education: N/A   Occupational History  . piano  [employer]   Social History Main Topics  . Smoking status: Never Smoker  . Smokeless tobacco: Never Used  . Alcohol use No  . Drug use: No  . Sexual activity: Yes    Partners: Male    Birth control/ protection: Condom   Other Topics Concern  . Not on file   Social History Narrative  . No narrative on file    Family History  Problem Relation Age of Onset  . Cancer Father 82       prostate  . Diabetes Daughter   . Celiac disease Daughter   . Anuerysm Maternal Grandfather   . Diabetes Daughter   . Prostate cancer Unknown   . Colon cancer Neg Hx     BP 100/70   Pulse 65   Ht 5\' 2"  (1.575 m)   Wt 122 lb (55.3 kg)   LMP 07/02/2011   BMI 22.31 kg/m   Review of Systems: See HPI above.     Objective:  Physical Exam:  Gen: NAD, comfortable in exam room  Left knee: No gross deformity, ecchymoses, effusion.  Mild fullness behind left knee but similar on right. No TTP. FROM. Negative ant/post drawers. Negative valgus/varus testing. Negative lachmanns. Negative mcmurrays, apleys, patellar apprehension. NV intact distally.  Right knee: FROM without pain.  Assessment & Plan:  1. Left knee pain - exam and ultrasound reassuring - she does have a very small bakers cyst but do not think this is causing her symptoms.  Describing snapping hamstring syndrome.  Start home exercises for this which were shown today.  Icing, tylenol or ibuprofen if needed.  No limitations on running or activities.  F/u in 4 weeks or prn.

## 2017-01-26 NOTE — Assessment & Plan Note (Signed)
exam and ultrasound reassuring - she does have a very small bakers cyst but do not think this is causing her symptoms.  Describing snapping hamstring syndrome.  Start home exercises for this which were shown today.  Icing, tylenol or ibuprofen if needed.  No limitations on running or activities.  F/u in 4 weeks or prn.

## 2017-02-13 ENCOUNTER — Other Ambulatory Visit: Payer: Self-pay | Admitting: Family Medicine

## 2017-03-04 ENCOUNTER — Encounter: Payer: Self-pay | Admitting: Obstetrics & Gynecology

## 2017-03-04 ENCOUNTER — Encounter: Payer: BLUE CROSS/BLUE SHIELD | Admitting: Women's Health

## 2017-03-04 ENCOUNTER — Ambulatory Visit (INDEPENDENT_AMBULATORY_CARE_PROVIDER_SITE_OTHER): Payer: BLUE CROSS/BLUE SHIELD | Admitting: Obstetrics & Gynecology

## 2017-03-04 VITALS — BP 108/70 | Ht 61.5 in | Wt 118.0 lb

## 2017-03-04 DIAGNOSIS — Z01419 Encounter for gynecological examination (general) (routine) without abnormal findings: Secondary | ICD-10-CM | POA: Diagnosis not present

## 2017-03-04 DIAGNOSIS — Z780 Asymptomatic menopausal state: Secondary | ICD-10-CM | POA: Diagnosis not present

## 2017-03-04 DIAGNOSIS — L80 Vitiligo: Secondary | ICD-10-CM | POA: Diagnosis not present

## 2017-03-04 NOTE — Patient Instructions (Signed)
1. Encounter for routine gynecological examination with Papanicolaou smear of cervix Normal gyn exam.  Pap reflex done.  Breasts wnl.  Labs with Fam MD.  Very fit, continue with running.  2. Menopause present No HRT.  No PMB.  No dyspareunia.  On Vit D supplement.  Ca++ in food.  Weight bearing physical activity to continue.  F/U Bone Density. - DG Bone Density; Future  3. Vitiligo Per patient, long standing stable areas of skin depigmentation.  Small stable area on left post vulva.  Reassured.  Stacey [name], it was a pleasure to meet you today!  I will inform you of your results as soon as available.     Health Maintenance for Postmenopausal Women Menopause is a normal process in which your reproductive ability comes to an end. This process happens gradually over a span of months to years, usually between the ages of 15 and 64. Menopause is complete when you have missed 12 consecutive menstrual periods. It is important to talk with your health care provider about some of the most common conditions that affect postmenopausal women, such as heart disease, cancer, and bone loss (osteoporosis). Adopting a healthy lifestyle and getting preventive care can help to promote your health and wellness. Those actions can also lower your chances of developing some of these common conditions. What should I know about menopause? During menopause, you may experience a number of symptoms, such as:  Moderate-to-severe hot flashes.  Night sweats.  Decrease in sex drive.  Mood swings.  Headaches.  Tiredness.  Irritability.  Memory problems.  Insomnia.  Choosing to treat or not to treat menopausal changes is an individual decision that you make with your health care provider. What should I know about hormone replacement therapy and supplements? Hormone therapy products are effective for treating symptoms that are associated with menopause, such as hot flashes and night sweats. Hormone replacement  carries certain risks, especially as you become older. If you are thinking about using estrogen or estrogen with progestin treatments, discuss the benefits and risks with your health care provider. What should I know about heart disease and stroke? Heart disease, heart attack, and stroke become more likely as you age. This may be due, in part, to the hormonal changes that your body experiences during menopause. These can affect how your body processes dietary fats, triglycerides, and cholesterol. Heart attack and stroke are both medical emergencies. There are many things that you can do to help prevent heart disease and stroke:  Have your blood pressure checked at least every 1-2 years. High blood pressure causes heart disease and increases the risk of stroke.  If you are 74-17 years old, ask your health care provider if you should take aspirin to prevent a heart attack or a stroke.  Do not use any tobacco products, including cigarettes, chewing tobacco, or electronic cigarettes. If you need help quitting, ask your health care provider.  It is important to eat a healthy diet and maintain a healthy weight. ? Be sure to include plenty of vegetables, fruits, low-fat dairy products, and lean protein. ? Avoid eating foods that are high in solid fats, added sugars, or salt (sodium).  Get regular exercise. This is one of the most important things that you can do for your health. ? Try to exercise for at least 150 minutes each week. The type of exercise that you do should increase your heart rate and make you sweat. This is known as moderate-intensity exercise. ? Try to do strengthening exercises  at least twice each week. Do these in addition to the moderate-intensity exercise.  Know your numbers.Ask your health care provider to check your cholesterol and your blood glucose. Continue to have your blood tested as directed by your health care provider.  What should I know about cancer screening? There  are several types of cancer. Take the following steps to reduce your risk and to catch any cancer development as early as possible. Breast Cancer  Practice breast self-awareness. ? This means understanding how your breasts normally appear and feel. ? It also means doing regular breast self-exams. Let your health care provider know about any changes, no matter how small.  If you are 54 or older, have a clinician do a breast exam (clinical breast exam or CBE) every year. Depending on your age, family history, and medical history, it may be recommended that you also have a yearly breast X-ray (mammogram).  If you have a family history of breast cancer, talk with your health care provider about genetic screening.  If you are at high risk for breast cancer, talk with your health care provider about having an MRI and a mammogram every year.  Breast cancer (BRCA) gene test is recommended for women who have family members with BRCA-related cancers. Results of the assessment will determine the need for genetic counseling and BRCA1 and for BRCA2 testing. BRCA-related cancers include these types: ? Breast. This occurs in males or females. ? Ovarian. ? Tubal. This may also be called fallopian tube cancer. ? Cancer of the abdominal or pelvic lining (peritoneal cancer). ? Prostate. ? Pancreatic.  Cervical, Uterine, and Ovarian Cancer Your health care provider may recommend that you be screened regularly for cancer of the pelvic organs. These include your ovaries, uterus, and vagina. This screening involves a pelvic exam, which includes checking for microscopic changes to the surface of your cervix (Pap test).  For women ages 21-65, health care providers may recommend a pelvic exam and a Pap test every three years. For women ages 22-65, they may recommend the Pap test and pelvic exam, combined with testing for human papilloma virus (HPV), every five years. Some types of HPV increase your risk of cervical  cancer. Testing for HPV may also be done on women of any age who have unclear Pap test results.  Other health care providers may not recommend any screening for nonpregnant women who are considered low risk for pelvic cancer and have no symptoms. Ask your health care provider if a screening pelvic exam is right for you.  If you have had past treatment for cervical cancer or a condition that could lead to cancer, you need Pap tests and screening for cancer for at least 20 years after your treatment. If Pap tests have been discontinued for you, your risk factors (such as having a new sexual partner) need to be reassessed to determine if you should start having screenings again. Some women have medical problems that increase the chance of getting cervical cancer. In these cases, your health care provider may recommend that you have screening and Pap tests more often.  If you have a family history of uterine cancer or ovarian cancer, talk with your health care provider about genetic screening.  If you have vaginal bleeding after reaching menopause, tell your health care provider.  There are currently no reliable tests available to screen for ovarian cancer.  Lung Cancer Lung cancer screening is recommended for adults 47-5 years old who are at high risk for lung  cancer because of a history of smoking. A yearly low-dose CT scan of the lungs is recommended if you:  Currently smoke.  Have a history of at least 30 pack-years of smoking and you currently smoke or have quit within the past 15 years. A pack-year is smoking an average of one pack of cigarettes per day for one year.  Yearly screening should:  Continue until it has been 15 years since you quit.  Stop if you develop a health problem that would prevent you from having lung cancer treatment.  Colorectal Cancer  This type of cancer can be detected and can often be prevented.  Routine colorectal cancer screening usually begins at age 73  and continues through age 14.  If you have risk factors for colon cancer, your health care provider may recommend that you be screened at an earlier age.  If you have a family history of colorectal cancer, talk with your health care provider about genetic screening.  Your health care provider may also recommend using home test kits to check for hidden blood in your stool.  A small camera at the end of a tube can be used to examine your colon directly (sigmoidoscopy or colonoscopy). This is done to check for the earliest forms of colorectal cancer.  Direct examination of the colon should be repeated every 5-10 years until age 57. However, if early forms of precancerous polyps or small growths are found or if you have a family history or genetic risk for colorectal cancer, you may need to be screened more often.  Skin Cancer  Check your skin from head to toe regularly.  Monitor any moles. Be sure to tell your health care provider: ? About any new moles or changes in moles, especially if there is a change in a mole's shape or color. ? If you have a mole that is larger than the size of a pencil eraser.  If any of your family members has a history of skin cancer, especially at a young age, talk with your health care provider about genetic screening.  Always use sunscreen. Apply sunscreen liberally and repeatedly throughout the day.  Whenever you are outside, protect yourself by wearing long sleeves, pants, a wide-brimmed hat, and sunglasses.  What should I know about osteoporosis? Osteoporosis is a condition in which bone destruction happens more quickly than new bone creation. After menopause, you may be at an increased risk for osteoporosis. To help prevent osteoporosis or the bone fractures that can happen because of osteoporosis, the following is recommended:  If you are 66-11 years old, get at least 1,000 mg of calcium and at least 600 mg of vitamin D per day.  If you are older than  age 56 but younger than age 22, get at least 1,200 mg of calcium and at least 600 mg of vitamin D per day.  If you are older than age 6, get at least 1,200 mg of calcium and at least 800 mg of vitamin D per day.  Smoking and excessive alcohol intake increase the risk of osteoporosis. Eat foods that are rich in calcium and vitamin D, and do weight-bearing exercises several times each week as directed by your health care provider. What should I know about how menopause affects my mental health? Depression may occur at any age, but it is more common as you become older. Common symptoms of depression include:  Low or sad mood.  Changes in sleep patterns.  Changes in appetite or eating patterns.  Feeling an overall lack of motivation or enjoyment of activities that you previously enjoyed.  Frequent crying spells.  Talk with your health care provider if you think that you are experiencing depression. What should I know about immunizations? It is important that you get and maintain your immunizations. These include:  Tetanus, diphtheria, and pertussis (Tdap) booster vaccine.  Influenza every year before the flu season begins.  Pneumonia vaccine.  Shingles vaccine.  Your health care provider may also recommend other immunizations. This information is not intended to replace advice given to you by your health care provider. Make sure you discuss any questions you have with your health care provider. Document Released: 07/16/2005 Document Revised: 12/12/2015 Document Reviewed: 02/25/2015 Elsevier Interactive Patient Education  2018 [employer].

## 2017-03-04 NOTE — Addendum Note (Signed)
Addended by: Thurnell Garbe A on: 03/04/2017 12:47 PM   Modules accepted: Orders

## 2017-03-04 NOTE — Progress Notes (Signed)
Stacey [name] 1960-10-11 [id number]   History:    56 y.o. G2P2L2  Married.  2 daughters doing well.  English as a second language teacher.  RP:  Established patient presenting for annual gyn exam   HPI:  Menopause.  No HRT.  No PMB.  No pelvic pain.  No dyspareunia.  Breasts wnl.  Mictions/BMs wnl.  Enjoys running.  BMI 21.93.  Past medical history,surgical history, family history and social history were all reviewed and documented in the EPIC chart.  Gynecologic History Patient's last menstrual period was 07/02/2011. Contraception: post menopausal status Last Pap: 2016. Results were: normal Last mammogram: 09/2016. Results were: negative  Obstetric History OB History  Gravida Para Term Preterm AB Living  2 2       2   SAB TAB Ectopic Multiple Live Births               # Outcome Date GA Lbr Len/2nd Weight Sex Delivery Anes PTL Lv  2 Para           1 Para                ROS: A ROS was performed and pertinent positives and negatives are included in the history.  GENERAL: No fevers or chills. HEENT: No change in vision, no earache, sore throat or sinus congestion. NECK: No pain or stiffness. CARDIOVASCULAR: No chest pain or pressure. No palpitations. PULMONARY: No shortness of breath, cough or wheeze. GASTROINTESTINAL: No abdominal pain, nausea, vomiting or diarrhea, melena or bright red blood per rectum. GENITOURINARY: No urinary frequency, urgency, hesitancy or dysuria. MUSCULOSKELETAL: No joint or muscle pain, no back pain, no recent trauma. DERMATOLOGIC: No rash, no itching, no lesions. ENDOCRINE: No polyuria, polydipsia, no heat or cold intolerance. No recent change in weight. HEMATOLOGICAL: No anemia or easy bruising or bleeding. NEUROLOGIC: No headache, seizures, numbness, tingling or weakness. PSYCHIATRIC: No depression, no loss of interest in normal activity or change in sleep pattern.     Exam:   Ht 5' 1.5" (1.562 m)   Wt 118 lb (53.5 kg)   LMP 07/02/2011   BMI 21.93 kg/m   Body mass  index is 21.93 kg/m.  General appearance : Well developed well nourished female. No acute distress HEENT: Eyes: no retinal hemorrhage or exudates,  Neck supple, trachea midline, no carotid bruits, no thyroidmegaly Lungs: Clear to auscultation, no rhonchi or wheezes, or rib retractions  Heart: Regular rate and rhythm, no murmurs or gallops Breast:Examined in sitting and supine position were symmetrical in appearance, no palpable masses or tenderness,  no skin retraction, no nipple inversion, no nipple discharge, no skin discoloration, no axillary or supraclavicular lymphadenopathy Abdomen: no palpable masses or tenderness, no rebound or guarding Extremities: no edema or skin discoloration or tenderness  Pelvic: Vulva normal  Bartholin, Urethra, Skene Glands: Within normal limits             Vagina: No gross lesions or discharge  Cervix: No gross lesions or discharge.  Pap/Reflex done.  Uterus  AV, normal size, shape and consistency, non-tender and mobile  Adnexa  Without masses or tenderness  Anus and perineum  normal    Assessment/Plan:  56 y.o. female for annual exam   1. Encounter for routine gynecological examination with Papanicolaou smear of cervix Normal gyn exam.  Pap reflex done.  Breasts wnl.  Labs with Fam MD.  Very fit, continue with running.  2. Menopause present No HRT.  No PMB.  No dyspareunia.  On Vit D supplement.  Ca++ in food.  Weight bearing physical activity to continue.  F/U Bone Density. - DG Bone Density; Future  3. Vitiligo Per patient, long standing stable areas of skin depigmentation.  Small stable area on left post vulva.  Reassured.   Princess Bruins MD, 11:18 AM 03/04/2017

## 2017-03-08 LAB — PAP IG W/ RFLX HPV ASCU

## 2017-05-05 ENCOUNTER — Other Ambulatory Visit: Payer: Self-pay | Admitting: Family Medicine

## 2017-05-05 DIAGNOSIS — H8110 Benign paroxysmal vertigo, unspecified ear: Secondary | ICD-10-CM

## 2017-05-16 ENCOUNTER — Other Ambulatory Visit: Payer: Self-pay | Admitting: Family Medicine

## 2017-08-02 ENCOUNTER — Encounter: Payer: Self-pay | Admitting: Family Medicine

## 2017-08-02 ENCOUNTER — Ambulatory Visit (INDEPENDENT_AMBULATORY_CARE_PROVIDER_SITE_OTHER): Payer: 59 | Admitting: Family Medicine

## 2017-08-02 DIAGNOSIS — R200 Anesthesia of skin: Secondary | ICD-10-CM

## 2017-08-02 NOTE — Progress Notes (Signed)
PCP: Ann Held, DO  Subjective:   HPI: Patient is a 57 y.o. female here for right foot numbness.  Patient reports for 4-5 months now she's had numbness distal forefoot more on plantar foot into 2nd-4th digits. Able to run without a problem. Does have pain 1st MTP but this is longstanding, bothers after running but not during, known arthritis here. Pain currently 0/10. Initially felt like something was in her shoe. Some swelling into these digits. No other skin changes.  Past Medical History:  Diagnosis Date  . Hyperlipidemia   . 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 Pollen allergies   . Thyroid disease 2008   HYPOTHYROIDISM    Current Outpatient Medications on File Prior to Visit  Medication Sig Dispense Refill  . ascorbic acid (VITAMIN C) 250 MG CHEW Chew 250 mg by mouth daily.    . cetirizine (ZYRTEC) 10 MG tablet Take 10 mg by mouth daily.    . Cholecalciferol (VITAMIN D) 1000 UNITS capsule Take 1,000 Units by mouth daily.      . Cyanocobalamin (B-12) 2500 MCG TABS Take 1 tablet by mouth daily.    . fluticasone (FLONASE) 50 MCG/ACT nasal spray USE TWO SPRAY(S) IN EACH NOSTRIL ONCE DAILY 16 g 6  . levothyroxine (SYNTHROID, LEVOTHROID) 75 MCG tablet TAKE ONE TABLET BY MOUTH BEFORE BREAKFAST 30 tablet 0  . levothyroxine (SYNTHROID, LEVOTHROID) 75 MCG tablet TAKE 1 TABLET BY MOUTH ONCE DAILY BEFORE BREAKFAST 90 tablet 0  . meclizine (ANTIVERT) 25 MG tablet TAKE 1 TABLET BY MOUTH THREE TIMES DAILY AS NEEDED FOR DIZZINESS 30 tablet 0  . Multiple Vitamin (MULTIVITAMIN) tablet Take 1 tablet by mouth daily.      . Omega-3 Fatty Acids (FISH OIL) 1000 MG CAPS Take by mouth.     No current facility-administered medications on file prior to visit.     Past Surgical History:  Procedure Laterality Date  . CESAREAN SECTION  1992  . POPLITEAL SYNOVIAL CYST EXCISION Left 1974    Allergies  Allergen Reactions  . Other     TREES, POLLEN, DUST, MOLD AND ANIMALS    Social History    Socioeconomic History  . Marital status: Married    Spouse name: Not on file  . Number of children: 2  . Years of education: Not on file  . Highest education level: Not on file  Social Needs  . Financial resource strain: Not on file  . Food insecurity - worry: Not on file  . Food insecurity - inability: Not on file  . Transportation needs - medical: Not on file  . Transportation needs - non-medical: Not on file  Occupational History  . Occupation: Primary school teacher: [employer]  Tobacco Use  . Smoking status: Never Smoker  . Smokeless tobacco: Never Used  Substance and Sexual Activity  . Alcohol use: No  . Drug use: No  . Sexual activity: Yes    Partners: Male    Birth control/protection: Condom  Other Topics Concern  . Not on file  Social History Narrative  . Not on file    Family History  Problem Relation Age of Onset  . Cancer Father 36       prostate  . Diabetes Daughter   . Celiac disease Daughter   . Anuerysm Maternal Grandfather   . Diabetes Daughter   . Prostate cancer Unknown   . Colon cancer Neg Hx     BP 116/77   Pulse 71  Ht 5\' 2"  (1.575 m)   Wt 120 lb (54.4 kg)   LMP 07/02/2011   BMI 21.95 kg/m   Review of Systems: See HPI above.     Objective:  Physical Exam:  Gen: NAD, comfortable in exam room  Right foot/ankle: Transverse arch collapse with mild callus 2nd-4th MT heads.  Mild swelling in 2nd-4th digits.  No other gross deformity, swelling, ecchymoses FROM with 5/5 strength. TTP mildly 2nd-4th MT heads. Sensation mildly decreased 2nd-4th digits.  Left foot/ankle: Transverse arch collapse.  No deformity. FROM with 5/5 strength. No tenderness to palpation. NVI distally.   Assessment & Plan:  1. Right foot numbness/pain - 2/2 metatarsalgia.  MT pad placed for her in insoles.  Icing, tylenol, motrin if needed.  Discussed shoe fitting at [facility].

## 2017-08-02 NOTE — Patient Instructions (Signed)
You have metatarsalgia. Wear the metatarsal pads in your running shoes. After 2-3 weeks you should notice the numbness is improving. When you get fitted for running shoes go to [facility]. Follow up with me in 6 weeks. We can put these pads in other shoes as well - you'd call to make a 15 minute appointment but it would be a no charge visit.

## 2017-08-02 NOTE — Assessment & Plan Note (Signed)
2/2 metatarsalgia.  MT pad placed for her in insoles.  Icing, tylenol, motrin if needed.  Discussed shoe fitting at [facility].

## 2017-08-04 ENCOUNTER — Ambulatory Visit: Payer: BLUE CROSS/BLUE SHIELD | Admitting: Family Medicine

## 2017-08-16 ENCOUNTER — Other Ambulatory Visit: Payer: Self-pay | Admitting: Family Medicine

## 2017-08-24 ENCOUNTER — Telehealth: Payer: Self-pay | Admitting: *Deleted

## 2017-08-24 ENCOUNTER — Other Ambulatory Visit: Payer: Self-pay | Admitting: *Deleted

## 2017-08-24 DIAGNOSIS — E039 Hypothyroidism, unspecified: Secondary | ICD-10-CM

## 2017-08-24 NOTE — Telephone Encounter (Signed)
Pharmacy stated that they will have to change brands on levothyroxine.  Advised patient that it was ok but she would need to come in to recheck TSH in 2 months.  Patient stated that she may see if she can find the brand that she is on at another pharmacy since she is been doing well.

## 2017-08-27 ENCOUNTER — Other Ambulatory Visit: Payer: Self-pay | Admitting: Family Medicine

## 2017-08-27 DIAGNOSIS — H8110 Benign paroxysmal vertigo, unspecified ear: Secondary | ICD-10-CM

## 2017-11-30 ENCOUNTER — Other Ambulatory Visit: Payer: Self-pay | Admitting: Family Medicine

## 2017-12-24 ENCOUNTER — Other Ambulatory Visit: Payer: Self-pay | Admitting: Family Medicine

## 2017-12-24 DIAGNOSIS — H8110 Benign paroxysmal vertigo, unspecified ear: Secondary | ICD-10-CM

## 2018-02-15 ENCOUNTER — Other Ambulatory Visit: Payer: Self-pay | Admitting: Family Medicine

## 2018-02-20 NOTE — Telephone Encounter (Signed)
Pt checking status on the levothyroxine (SYNTHROID, LEVOTHROID) 75 MCG tablet Being sent in to the pharmacy.

## 2018-03-17 ENCOUNTER — Ambulatory Visit (INDEPENDENT_AMBULATORY_CARE_PROVIDER_SITE_OTHER): Payer: 59 | Admitting: Family Medicine

## 2018-03-17 ENCOUNTER — Encounter: Payer: Self-pay | Admitting: Family Medicine

## 2018-03-17 VITALS — BP 116/77 | HR 74 | Ht 62.0 in | Wt 117.0 lb

## 2018-03-17 DIAGNOSIS — M25512 Pain in left shoulder: Secondary | ICD-10-CM

## 2018-03-17 DIAGNOSIS — G8929 Other chronic pain: Secondary | ICD-10-CM | POA: Diagnosis not present

## 2018-03-17 MED ORDER — NITROGLYCERIN 0.2 MG/HR TD PT24: MEDICATED_PATCH | TRANSDERMAL | 1 refills | Status: DC

## 2018-03-17 NOTE — Patient Instructions (Signed)
You have rotator cuff impingement of your supraspinatus and infraspinatus muscles. Try to avoid painful activities (overhead activities, lifting with extended arm) as much as possible. Aleve 2 tabs twice a day with food OR ibuprofen 3 tabs three times a day with food for pain and inflammation. Nitro patches 1/4th patch to affected shoulder, change daily. Can take tylenol in addition to this. Subacromial injection may be beneficial to help with pain and to decrease inflammation. Consider physical therapy with transition to home exercise program. Do home exercise program with theraband and scapular stabilization exercises daily 3 sets of 10 once a day. If not improving at follow-up we will consider imaging, injection, physical therapy, and/or nitro patches. Follow up with me in 6 weeks.

## 2018-03-20 ENCOUNTER — Encounter: Payer: Self-pay | Admitting: Family Medicine

## 2018-03-20 NOTE — Progress Notes (Signed)
PCP: Ann Held, DO  Subjective:   HPI: Patient is a 57 y.o. female here for left shoulder pain.  Patient reports she's had 1 year of left shoulder pain. Feels this mainly posterior at a 2/10 level, more of a soreness. No catching or locking. Can wake her up at night. Doing some home exercises and taking aleve. No numbness, skin changes.  Past Medical History:  Diagnosis Date  . Hyperlipidemia   . 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 Pollen allergies   . Thyroid disease 2008   HYPOTHYROIDISM    Current Outpatient Medications on File Prior to Visit  Medication Sig Dispense Refill  . ascorbic acid (VITAMIN C) 250 MG CHEW Chew 250 mg by mouth daily.    . cetirizine (ZYRTEC) 10 MG tablet Take 10 mg by mouth daily.    . Cholecalciferol (VITAMIN D) 1000 UNITS capsule Take 1,000 Units by mouth daily.      . Cyanocobalamin (B-12) 2500 MCG TABS Take 1 tablet by mouth daily.    . fluticasone (FLONASE) 50 MCG/ACT nasal spray USE 2 SPRAY(S) IN EACH NOSTRIL ONCE DAILY 16 g 6  . levothyroxine (SYNTHROID, LEVOTHROID) 75 MCG tablet TAKE 1 TABLET BY MOUTH ONCE DAILY BEFORE BREAKFAST 90 tablet 0  . meclizine (ANTIVERT) 25 MG tablet Take 1 tablet (25 mg total) by mouth 3 (three) times daily as needed for dizziness. 30 tablet 0  . Multiple Vitamin (MULTIVITAMIN) tablet Take 1 tablet by mouth daily.      . Omega-3 Fatty Acids (FISH OIL) 1000 MG CAPS Take by mouth.     No current facility-administered medications on file prior to visit.     Past Surgical History:  Procedure Laterality Date  . CESAREAN SECTION  1992  . POPLITEAL SYNOVIAL CYST EXCISION Left 1974    Allergies  Allergen Reactions  . Other     TREES, POLLEN, DUST, MOLD AND ANIMALS    Social History   Socioeconomic History  . Marital status: Married    Spouse name: Not on file  . Number of children: 2  . Years of education: Not on file  . Highest education level: Not on file  Occupational History  . Occupation: Haematologist: [employer]  Social Needs  . Financial resource strain: Not on file  . Food insecurity:    Worry: Not on file    Inability: Not on file  . Transportation needs:    Medical: Not on file    Non-medical: Not on file  Tobacco Use  . Smoking status: Never Smoker  . Smokeless tobacco: Never Used  Substance and Sexual Activity  . Alcohol use: No  . Drug use: No  . Sexual activity: Yes    Partners: Male    Birth control/protection: Condom  Lifestyle  . Physical activity:    Days per week: Not on file    Minutes per session: Not on file  . Stress: Not on file  Relationships  . Social connections:    Talks on phone: Not on file    Gets together: Not on file    Attends religious service: Not on file    Active member of club or organization: Not on file    Attends meetings of clubs or organizations: Not on file    Relationship status: Not on file  . Intimate partner violence:    Fear of current or ex partner: Not on file    Emotionally abused: Not on file  Physically abused: Not on file    Forced sexual activity: Not on file  Other Topics Concern  . Not on file  Social History Narrative  . Not on file    Family History  Problem Relation Age of Onset  . Cancer Father 64       prostate  . Diabetes Daughter   . Celiac disease Daughter   . Anuerysm Maternal Grandfather   . Diabetes Daughter   . Prostate cancer Unknown   . Colon cancer Neg Hx     BP 116/77   Pulse 74   Ht 5\' 2"  (1.575 m)   Wt 117 lb (53.1 kg)   LMP 07/02/2011   BMI 21.40 kg/m   Review of Systems: See HPI above.     Objective:  Physical Exam:  Gen: NAD, comfortable in exam room  Left shoulder: No swelling, ecchymoses.  No gross deformity. No TTP. FROM with pain on ER, mild with empty can. Positive Hawkins, negative Neers. Negative Yergasons. Strength 5/5 with empty can and resisted internal/external rotation.   Negative apprehension. NV intact  distally.  Right shoulder: No swelling, ecchymoses.  No gross deformity. No TTP. FROM. Strength 5/5 with empty can and resisted internal/external rotation. NV intact distally.   Assessment & Plan:  1. Left shoulder pain - 2/2 rotator cuff impingement of supraspinatus and infraspinatus.  Aleve or ibuprofen, nitro patches.  Reviewed HEP to do daily.  Consider physical therapy.  F/u in 6 weeks.

## 2018-04-28 ENCOUNTER — Ambulatory Visit: Payer: 59 | Admitting: Family Medicine

## 2018-05-12 ENCOUNTER — Other Ambulatory Visit: Payer: Self-pay | Admitting: Family Medicine

## 2018-05-12 DIAGNOSIS — H8110 Benign paroxysmal vertigo, unspecified ear: Secondary | ICD-10-CM

## 2018-05-19 ENCOUNTER — Other Ambulatory Visit: Payer: Self-pay | Admitting: Family Medicine

## 2018-06-17 ENCOUNTER — Other Ambulatory Visit: Payer: Self-pay | Admitting: Family Medicine

## 2018-07-04 ENCOUNTER — Other Ambulatory Visit: Payer: Self-pay | Admitting: Family Medicine

## 2018-07-05 LAB — HM MAMMOGRAPHY

## 2018-07-06 ENCOUNTER — Ambulatory Visit (INDEPENDENT_AMBULATORY_CARE_PROVIDER_SITE_OTHER): Payer: 59 | Admitting: Family Medicine

## 2018-07-06 ENCOUNTER — Encounter: Payer: Self-pay | Admitting: Family Medicine

## 2018-07-06 VITALS — Temp 97.6°F | Ht 61.5 in | Wt 121.0 lb

## 2018-07-06 DIAGNOSIS — J0140 Acute pansinusitis, unspecified: Secondary | ICD-10-CM | POA: Diagnosis not present

## 2018-07-06 MED ORDER — FLUTICASONE PROPIONATE 50 MCG/ACT NA SUSP: 2.0000 | Freq: Every day | NASAL | 6 refills | Status: DC

## 2018-07-06 MED ORDER — CEFUROXIME AXETIL 500 MG PO TABS: 500.0000 mg | ORAL_TABLET | Freq: Two times a day (BID) | ORAL | 0 refills | Status: DC

## 2018-07-06 NOTE — Patient Instructions (Signed)
Sinusitis, Adult  Sinusitis is inflammation of your sinuses. Sinuses are hollow spaces in the bones around your face. Your sinuses are located:   Around your eyes.   In the middle of your forehead.   Behind your nose.   In your cheekbones.  Mucus normally drains out of your sinuses. When your nasal tissues become inflamed or swollen, mucus can become trapped or blocked. This allows bacteria, viruses, and fungi to grow, which leads to infection. Most infections of the sinuses are caused by a virus.  Sinusitis can develop quickly. It can last for up to 4 weeks (acute) or for more than 12 weeks (chronic). Sinusitis often develops after a cold.  What are the causes?  This condition is caused by anything that creates swelling in the sinuses or stops mucus from draining. This includes:   Allergies.   Asthma.   Infection from bacteria or viruses.   Deformities or blockages in your nose or sinuses.   Abnormal growths in the nose (nasal polyps).   Pollutants, such as chemicals or irritants in the air.   Infection from fungi (rare).  What increases the risk?  You are more likely to develop this condition if you:   Have a weak body defense system (immune system).   Do a lot of swimming or diving.   Overuse nasal sprays.   Smoke.  What are the signs or symptoms?  The main symptoms of this condition are pain and a feeling of pressure around the affected sinuses. Other symptoms include:   Stuffy nose or congestion.   Thick drainage from your nose.   Swelling and warmth over the affected sinuses.   Headache.   Upper toothache.   A cough that may get worse at night.   Extra mucus that collects in the throat or the back of the nose (postnasal drip).   Decreased sense of smell and taste.   Fatigue.   A fever.   Sore throat.   Bad breath.  How is this diagnosed?  This condition is diagnosed based on:   Your symptoms.   Your medical history.   A physical exam.   Tests to find out if your condition is  acute or chronic. This may include:  ? Checking your nose for nasal polyps.  ? Viewing your sinuses using a device that has a light (endoscope).  ? Testing for allergies or bacteria.  ? Imaging tests, such as an MRI or CT scan.  In rare cases, a bone biopsy may be done to rule out more serious types of fungal sinus disease.  How is this treated?  Treatment for sinusitis depends on the cause and whether your condition is chronic or acute.   If caused by a virus, your symptoms should go away on their own within 10 days. You may be given medicines to relieve symptoms. They include:  ? Medicines that shrink swollen nasal passages (topical intranasal decongestants).  ? Medicines that treat allergies (antihistamines).  ? A spray that eases inflammation of the nostrils (topical intranasal corticosteroids).  ? Rinses that help get rid of thick mucus in your nose (nasal saline washes).   If caused by bacteria, your health care provider may recommend waiting to see if your symptoms improve. Most bacterial infections will get better without antibiotic medicine. You may be given antibiotics if you have:  ? A severe infection.  ? A weak immune system.   If caused by narrow nasal passages or nasal polyps, you may need   to have surgery.  Follow these instructions at home:  Medicines   Take, use, or apply over-the-counter and prescription medicines only as told by your health care provider. These may include nasal sprays.   If you were prescribed an antibiotic medicine, take it as told by your health care provider. Do not stop taking the antibiotic even if you start to feel better.  Hydrate and humidify     Drink enough fluid to keep your urine pale yellow. Staying hydrated will help to thin your mucus.   Use a cool mist humidifier to keep the humidity level in your home above 50%.   Inhale steam for 10-15 minutes, 3-4 times a day, or as told by your health care provider. You can do this in the bathroom while a hot shower is  running.   Limit your exposure to cool or dry air.  Rest   Rest as much as possible.   Sleep with your head raised (elevated).   Make sure you get enough sleep each night.  General instructions     Apply a warm, moist washcloth to your face 3-4 times a day or as told by your health care provider. This will help with discomfort.   Wash your hands often with soap and water to reduce your exposure to germs. If soap and water are not available, use hand sanitizer.   Do not smoke. Avoid being around people who are smoking (secondhand smoke).   Keep all follow-up visits as told by your health care provider. This is important.  Contact a health care provider if:   You have a fever.   Your symptoms get worse.   Your symptoms do not improve within 10 days.  Get help right away if:   You have a severe headache.   You have persistent vomiting.   You have severe pain or swelling around your face or eyes.   You have vision problems.   You develop confusion.   Your neck is stiff.   You have trouble breathing.  Summary   Sinusitis is soreness and inflammation of your sinuses. Sinuses are hollow spaces in the bones around your face.   This condition is caused by nasal tissues that become inflamed or swollen. The swelling traps or blocks the flow of mucus. This allows bacteria, viruses, and fungi to grow, which leads to infection.   If you were prescribed an antibiotic medicine, take it as told by your health care provider. Do not stop taking the antibiotic even if you start to feel better.   Keep all follow-up visits as told by your health care provider. This is important.  This information is not intended to replace advice given to you by your health care provider. Make sure you discuss any questions you have with your health care provider.  Document Released: 05/24/2005 Document Revised: 10/24/2017 Document Reviewed: 10/24/2017  Elsevier Interactive Patient Education  2019 [employer]

## 2018-07-06 NOTE — Progress Notes (Signed)
Patient ID: Stacey [name], female    DOB: 1960/06/26  Age: 58 y.o. MRN: [id number]    Subjective:  Subjective  HPI Stacey [name] presents for uri symptoms , headache since last Thursday.  + sinus presure  /ha  + congestion   + some light headedness + PND  She is using her flonase and alka seltzer plus cold and sinus    Review of Systems  Constitutional: Positive for chills and fatigue. Negative for fever.  HENT: Positive for congestion, postnasal drip, rhinorrhea, sinus pressure and sore throat.   Respiratory: Negative for cough, shortness of breath and wheezing.   Cardiovascular: Negative for chest pain, palpitations and leg swelling.  Allergic/Immunologic: Negative for environmental allergies.    History Past Medical History:  Diagnosis Date  . Hyperlipidemia   . 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 Pollen allergies   . Thyroid disease 2008   HYPOTHYROIDISM    She has a past surgical history that includes Cesarean section (1992) and Popliteal synovial cyst excision (Left, 1974).   Her family history includes Anuerysm in her maternal grandfather; Cancer (age of onset: 78) in her father; Celiac disease in her daughter; Diabetes in her daughter and daughter; Prostate cancer in an other family member.She reports that she has never smoked. She has never used smokeless tobacco. She reports that she does not drink alcohol or use drugs.  Current Outpatient Medications on File Prior to Visit  Medication Sig Dispense Refill  . ascorbic acid (VITAMIN C) 250 MG CHEW Chew 250 mg by mouth daily.    . cetirizine (ZYRTEC) 10 MG tablet Take 10 mg by mouth daily.    . Cholecalciferol (VITAMIN D) 1000 UNITS capsule Take 1,000 Units by mouth daily.      . Cyanocobalamin (B-12) 2500 MCG TABS Take 1 tablet by mouth daily.    . fluticasone (FLONASE) 50 MCG/ACT nasal spray USE 2 SPRAY(S) IN EACH NOSTRIL ONCE DAILY 16 g 6  . levothyroxine (SYNTHROID, LEVOTHROID) 75 MCG tablet TAKE 1 TABLET BY MOUTH ONCE DAILY BEFORE  BREAKFAST NEED  APPT  FOR  FUTURE  REFILLS 15 tablet 0  . meclizine (ANTIVERT) 25 MG tablet TAKE 1 TABLET BY MOUTH THREE TIMES DAILY AS NEEDED FOR DIZZINESS 30 tablet 0  . Multiple Vitamin (MULTIVITAMIN) tablet Take 1 tablet by mouth daily.      . nitroGLYCERIN (NITRODUR - DOSED IN MG/24 HR) 0.2 mg/hr patch Apply 1/4th patch to affected area, change daily 30 patch 1  . Omega-3 Fatty Acids (FISH OIL) 1000 MG CAPS Take by mouth.     No current facility-administered medications on file prior to visit.      Objective:  Objective  Physical Exam Vitals signs and nursing note reviewed.  Constitutional:      Appearance: She is well-developed.  HENT:     Right Ear: External ear normal.     Left Ear: External ear normal.     Nose:     Right Sinus: Maxillary sinus tenderness and frontal sinus tenderness present.     Left Sinus: Maxillary sinus tenderness and frontal sinus tenderness present.     Mouth/Throat:     Pharynx: Posterior oropharyngeal erythema present.  Eyes:     General:        Right eye: No discharge.        Left eye: No discharge.     Conjunctiva/sclera: Conjunctivae normal.  Cardiovascular:     Rate and Rhythm: Normal rate and regular rhythm.     Heart sounds:  Normal heart sounds. No murmur.  Pulmonary:     Effort: Pulmonary effort is normal. No respiratory distress.     Breath sounds: Normal breath sounds. No wheezing or rales.  Chest:     Chest wall: No tenderness.  Lymphadenopathy:     Cervical: Cervical adenopathy present.  Neurological:     Mental Status: She is alert and oriented to person, place, and time.    Temp 97.6 F (36.4 C)   Ht 5' 1.5" (1.562 m)   Wt 121 lb (54.9 kg)   LMP 07/02/2011   SpO2 98%   BMI 22.49 kg/m  Wt Readings from Last 3 Encounters:  07/06/18 121 lb (54.9 kg)  03/17/18 117 lb (53.1 kg)  08/02/17 120 lb (54.4 kg)     Lab Results  Component Value Date   WBC 5.3 01/30/2015   HGB 14.6 01/30/2015   HCT 43.4 01/30/2015   PLT  234 01/30/2015   GLUCOSE 100 (H) 09/05/2015   CHOL 230 (H) 12/13/2014   TRIG 89.0 12/13/2014   HDL 66.50 12/13/2014   LDLCALC 146 (H) 12/13/2014   ALT 16 12/13/2014   AST 23 12/13/2014   NA 138 09/05/2015   K 4.0 09/05/2015   CL 103 09/05/2015   CREATININE 0.71 09/05/2015   BUN 14 09/05/2015   CO2 32 09/05/2015   TSH 1.42 06/18/2016   HGBA1C 6.0 09/05/2015   MICROALBUR 0.7 12/13/2014    [place] Chest  Result Date: 05/29/2015 CLINICAL DATA:  C/o palpable hard painless lump over medial end of Rt clavicle x 2 days, patient had an xray of clavicle to eval. EXAM: CHEST ULTRASOUND COMPARISON:  None. FINDINGS: There is hypo echogenicity surrounding the medial margin of the right clavicle extending to the sternoclavicular joint. This is likely capsular hypertrophy. There may be a joint effusion. Echogenic foci are seen within this. This is asymmetric when compared to the left side. IMPRESSION: Abnormality surrounding the right clavicular head and right sternoclavicular joint. This is likely either capsular hypertrophy or capsular distention from a joint effusion. If further imaging is desired clinically, this would be best performed with MRI or CT. MRI would be the most sensitive and specific modality. Electronically Signed   By: Lajean Manes M.D.   On: 05/29/2015 16:00     Assessment & Plan:  Plan  I am having Sady Mantei start on cefUROXime and fluticasone. I am also having her maintain her Vitamin D, multivitamin, cetirizine, B-12, Fish Oil, ascorbic acid, fluticasone, nitroGLYCERIN, meclizine, and levothyroxine.  Meds ordered this encounter  Medications  . cefUROXime (CEFTIN) 500 MG tablet    Sig: Take 1 tablet (500 mg total) by mouth 2 (two) times daily with a meal.    Dispense:  20 tablet    Refill:  0  . fluticasone (FLONASE) 50 MCG/ACT nasal spray    Sig: Place 2 sprays into both nostrils daily.    Dispense:  16 g    Refill:  6    Problem List Items Addressed This Visit    None     Visit Diagnoses    Acute non-recurrent pansinusitis    -  Primary   Relevant Medications   cefUROXime (CEFTIN) 500 MG tablet   fluticasone (FLONASE) 50 MCG/ACT nasal spray      Follow-up: Return if symptoms worsen or fail to improve.  Ann Held, DO

## 2018-07-14 ENCOUNTER — Other Ambulatory Visit: Payer: Self-pay | Admitting: Family Medicine

## 2018-08-15 ENCOUNTER — Other Ambulatory Visit: Payer: Self-pay | Admitting: Family Medicine

## 2018-09-15 ENCOUNTER — Other Ambulatory Visit: Payer: Self-pay | Admitting: Family Medicine

## 2018-09-19 NOTE — Telephone Encounter (Signed)
Left message on machine to call to make follow up virtual visit.

## 2018-09-20 ENCOUNTER — Ambulatory Visit (INDEPENDENT_AMBULATORY_CARE_PROVIDER_SITE_OTHER): Payer: 59 | Admitting: Family Medicine

## 2018-09-20 ENCOUNTER — Encounter: Payer: Self-pay | Admitting: Family Medicine

## 2018-09-20 ENCOUNTER — Telehealth: Payer: Self-pay | Admitting: Family Medicine

## 2018-09-20 ENCOUNTER — Other Ambulatory Visit: Payer: Self-pay

## 2018-09-20 DIAGNOSIS — E039 Hypothyroidism, unspecified: Secondary | ICD-10-CM

## 2018-09-20 NOTE — Telephone Encounter (Signed)
Pt scheduled for tomorrow at 9am.  

## 2018-09-20 NOTE — Progress Notes (Signed)
Virtual Visit via Video Note  I connected with Stacey [name] on 09/20/18 at 10:15 AM EDT by a video enabled telemedicine application and verified that I am speaking with the correct person using two identifiers.   I discussed the limitations of evaluation and management by telemedicine and the availability of in person appointments. The patient expressed understanding and agreed to proceed.  History of Present Illness: Pt is home --  Needs f/u thyroid meds.  No complaints     Provider-- working from home  Observations/Objective: Afebrile,  rr normal,  Unable to get vitals  Pt in NAD   Assessment and Plan: 1. Hypothyroidism, unspecified type Refill sent  Labs to be scheduled  - Thyroid Panel With TSH; Future   Follow Up Instructions:    I discussed the assessment and treatment plan with the patient. The patient was provided an opportunity to ask questions and all were answered. The patient agreed with the plan and demonstrated an understanding of the instructions.   The patient was advised to call back or seek an in-person evaluation if the symptoms worsen or if the condition fails to improve as anticipated.    Ann Held, DO

## 2018-09-21 ENCOUNTER — Other Ambulatory Visit (INDEPENDENT_AMBULATORY_CARE_PROVIDER_SITE_OTHER): Payer: 59

## 2018-09-21 ENCOUNTER — Other Ambulatory Visit: Payer: Self-pay

## 2018-09-21 DIAGNOSIS — E039 Hypothyroidism, unspecified: Secondary | ICD-10-CM | POA: Diagnosis not present

## 2018-09-22 LAB — THYROID PANEL WITH TSH
Free Thyroxine Index: 3 (ref 1.4–3.8)
T3 Uptake: 35 % (ref 22–35)
T4, Total: 8.7 ug/dL (ref 5.1–11.9)
TSH: 0.63 mIU/L (ref 0.40–4.50)

## 2018-09-25 ENCOUNTER — Encounter: Payer: Self-pay | Admitting: *Deleted

## 2018-10-03 ENCOUNTER — Other Ambulatory Visit: Payer: Self-pay | Admitting: Family Medicine

## 2018-10-03 DIAGNOSIS — H8110 Benign paroxysmal vertigo, unspecified ear: Secondary | ICD-10-CM

## 2018-10-16 ENCOUNTER — Other Ambulatory Visit: Payer: Self-pay | Admitting: Family Medicine

## 2018-12-11 ENCOUNTER — Other Ambulatory Visit: Payer: Self-pay | Admitting: Family Medicine

## 2018-12-11 DIAGNOSIS — H8110 Benign paroxysmal vertigo, unspecified ear: Secondary | ICD-10-CM

## 2019-03-14 ENCOUNTER — Encounter: Payer: Self-pay | Admitting: Women's Health

## 2019-03-14 ENCOUNTER — Ambulatory Visit (INDEPENDENT_AMBULATORY_CARE_PROVIDER_SITE_OTHER): Payer: No Typology Code available for payment source | Admitting: Women's Health

## 2019-03-14 ENCOUNTER — Other Ambulatory Visit: Payer: Self-pay

## 2019-03-14 VITALS — BP 115/75 | Ht 61.5 in | Wt 119.6 lb

## 2019-03-14 DIAGNOSIS — Z01419 Encounter for gynecological examination (general) (routine) without abnormal findings: Secondary | ICD-10-CM

## 2019-03-14 DIAGNOSIS — R8290 Unspecified abnormal findings in urine: Secondary | ICD-10-CM

## 2019-03-14 DIAGNOSIS — Z13220 Encounter for screening for lipoid disorders: Secondary | ICD-10-CM

## 2019-03-14 NOTE — Patient Instructions (Addendum)
Vit D 2000 iu daily It was good to see you today! Continue 3D mammograms breast tissue is dense better screening tool for you  Health Maintenance for Postmenopausal Women Menopause is a normal process in which your ability to get pregnant comes to an end. This process happens slowly over many months or years, usually between the ages of 60 and 32. Menopause is complete when you have missed your menstrual periods for 12 months. It is important to talk with your health care provider about some of the most common conditions that affect women after menopause (postmenopausal women). These include heart disease, cancer, and bone loss (osteoporosis). Adopting a healthy lifestyle and getting preventive care can help to promote your health and wellness. The actions you take can also lower your chances of developing some of these common conditions. What should I know about menopause? During menopause, you may get a number of symptoms, such as:  Hot flashes. These can be moderate or severe.  Night sweats.  Decrease in sex drive.  Mood swings.  Headaches.  Tiredness.  Irritability.  Memory problems.  Insomnia. Choosing to treat or not to treat these symptoms is a decision that you make with your health care provider. Do I need hormone replacement therapy?  Hormone replacement therapy is effective in treating symptoms that are caused by menopause, such as hot flashes and night sweats.  Hormone replacement carries certain risks, especially as you become older. If you are thinking about using estrogen or estrogen with progestin, discuss the benefits and risks with your health care provider. What is my risk for heart disease and stroke? The risk of heart disease, heart attack, and stroke increases as you age. One of the causes may be a change in the body's hormones during menopause. This can affect how your body uses dietary fats, triglycerides, and cholesterol. Heart attack and stroke are medical  emergencies. There are many things that you can do to help prevent heart disease and stroke. Watch your blood pressure  High blood pressure causes heart disease and increases the risk of stroke. This is more likely to develop in people who have high blood pressure readings, are of African descent, or are overweight.  Have your blood pressure checked: ? Every 3-5 years if you are 88-17 years of age. ? Every year if you are 35 years old or older. Eat a healthy diet   Eat a diet that includes plenty of vegetables, fruits, low-fat dairy products, and lean protein.  Do not eat a lot of foods that are high in solid fats, added sugars, or sodium. Get regular exercise Get regular exercise. This is one of the most important things you can do for your health. Most adults should:  Try to exercise for at least 150 minutes each week. The exercise should increase your heart rate and make you sweat (moderate-intensity exercise).  Try to do strengthening exercises at least twice each week. Do these in addition to the moderate-intensity exercise.  Spend less time sitting. Even light physical activity can be beneficial. Other tips  Work with your health care provider to achieve or maintain a healthy weight.  Do not use any products that contain nicotine or tobacco, such as cigarettes, e-cigarettes, and chewing tobacco. If you need help quitting, ask your health care provider.  Know your numbers. Ask your health care provider to check your cholesterol and your blood sugar (glucose). Continue to have your blood tested as directed by your health care provider. Do I need  screening for cancer? Depending on your health history and family history, you may need to have cancer screening at different stages of your life. This may include screening for:  Breast cancer.  Cervical cancer.  Lung cancer.  Colorectal cancer. What is my risk for osteoporosis? After menopause, you may be at increased risk for  osteoporosis. Osteoporosis is a condition in which bone destruction happens more quickly than new bone creation. To help prevent osteoporosis or the bone fractures that can happen because of osteoporosis, you may take the following actions:  If you are 40-27 years old, get at least 1,000 mg of calcium and at least 600 mg of vitamin D per day.  If you are older than age 10 but younger than age 46, get at least 1,200 mg of calcium and at least 600 mg of vitamin D per day.  If you are older than age 38, get at least 1,200 mg of calcium and at least 800 mg of vitamin D per day. Smoking and drinking excessive alcohol increase the risk of osteoporosis. Eat foods that are rich in calcium and vitamin D, and do weight-bearing exercises several times each week as directed by your health care provider. How does menopause affect my mental health? Depression may occur at any age, but it is more common as you become older. Common symptoms of depression include:  Low or sad mood.  Changes in sleep patterns.  Changes in appetite or eating patterns.  Feeling an overall lack of motivation or enjoyment of activities that you previously enjoyed.  Frequent crying spells. Talk with your health care provider if you think that you are experiencing depression. General instructions See your health care provider for regular wellness exams and vaccines. This may include:  Scheduling regular health, dental, and eye exams.  Getting and maintaining your vaccines. These include: ? Influenza vaccine. Get this vaccine each year before the flu season begins. ? Pneumonia vaccine. ? Shingles vaccine. ? Tetanus, diphtheria, and pertussis (Tdap) booster vaccine. Your health care provider may also recommend other immunizations. Tell your health care provider if you have ever been abused or do not feel safe at home. Summary  Menopause is a normal process in which your ability to get pregnant comes to an end.  This  condition causes hot flashes, night sweats, decreased interest in sex, mood swings, headaches, or lack of sleep.  Treatment for this condition may include hormone replacement therapy.  Take actions to keep yourself healthy, including exercising regularly, eating a healthy diet, watching your weight, and checking your blood pressure and blood sugar levels.  Get screened for cancer and depression. Make sure that you are up to date with all your vaccines. This information is not intended to replace advice given to you by your health care provider. Make sure you discuss any questions you have with your health care provider. Document Released: 07/16/2005 Document Revised: 05/17/2018 Document Reviewed: 05/17/2018 Elsevier Patient Education  2020 [employer].

## 2019-03-14 NOTE — Progress Notes (Signed)
Stacey [name] 08-12-1960 [id number]    History:    Presents for annual exam.  Postmenopausal on no HRT with no bleeding.  Normal Pap and mammogram history overdue for mammogram history of dense breast tissue.  Hypothyroid primary care manages.  2015- colonoscopy.  Normal DEXA.  Past medical history, past surgical history, family history and social history were all reviewed and documented in the EPIC chart.Stacey [name] teacher.  Both daughters type 1 diabetes, Stacey [name] also has hypothyroidism, celiac and  autoimmune neuromuscular disorder / wheelchair-bound recent fall from her wheelchair causing a fractured skull.  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  Husband type 2 diabetes.  ROS:  A ROS was performed and pertinent positives and negatives are included.  Exam:  Vitals:   03/14/19 1117  BP: 115/75  Weight: 119 lb 9.6 oz (54.3 kg)  Height: 5' 1.5" (1.562 m)   Body mass index is 22.23 kg/m.   General appearance:  Normal Thyroid:  Symmetrical, normal in size, without palpable masses or nodularity. Respiratory  Auscultation:  Clear without wheezing or rhonchi Cardiovascular  Auscultation:  Regular rate, without rubs, murmurs or gallops  Edema/varicosities:  Not grossly evident Abdominal  Soft,nontender, without masses, guarding or rebound.  Liver/spleen:  No organomegaly noted  Hernia:  None appreciated  Skin  Inspection:  Grossly normal   Breasts: Examined lying and sitting. Vitiligo on both areolas     Right: Without masses, retractions, discharge or axillary adenopathy.     Left: Without masses, retractions, discharge or axillary adenopathy. Gentitourinary   Inguinal/mons:  Normal without inguinal adenopathy  External genitalia:  Normal  BUS/Urethra/Skene's glands:  Normal  Vagina:  Normal  Cervix:  Normal  Uterus:  normal in size, shape and contour.  Midline and mobile  Adnexa/parametria:     Rt: Without masses or tenderness.   Lt: Without masses or  tenderness.  Anus and perineum: Normal  Digital rectal exam: Normal sphincter tone without palpated masses or tenderness UA: Negative nitrites, negative leukocytes, no RBCs, moderate bacteria  Assessment/Plan:  58 y.o. MWF G2, P2 for annual exam with complaint urine odor without burning, frequency, or pain at end of stream of urination.Marland Kitchen  Postmenopausal/no HRT/no bleeding Hypothyroid-primary care manages labs and meds  Plan: SBEs,  annual screening mammogram, overdue instructed to schedule..  Continue healthy lifestyle of regular exercise of running, calcium rich foods, vitamin D 2000 daily encouraged.  CBC, CMP, lipid panel, Pap normal 2018, new screening guidelines reviewed..  Urine results reviewed, urine culture pending.    [place], 11:52 AM 03/14/2019

## 2019-03-15 LAB — COMPREHENSIVE METABOLIC PANEL
AG Ratio: 1.7 (calc) (ref 1.0–2.5)
ALT: 14 U/L (ref 6–29)
AST: 25 U/L (ref 10–35)
Albumin: 4.4 g/dL (ref 3.6–5.1)
Alkaline phosphatase (APISO): 72 U/L (ref 37–153)
BUN: 20 mg/dL (ref 7–25)
CO2: 32 mmol/L (ref 20–32)
Calcium: 10 mg/dL (ref 8.6–10.4)
Chloride: 103 mmol/L (ref 98–110)
Creat: 0.73 mg/dL (ref 0.50–1.05)
Globulin: 2.6 g/dL (calc) (ref 1.9–3.7)
Glucose, Bld: 97 mg/dL (ref 65–99)
Potassium: 4.5 mmol/L (ref 3.5–5.3)
Sodium: 140 mmol/L (ref 135–146)
Total Bilirubin: 0.6 mg/dL (ref 0.2–1.2)
Total Protein: 7 g/dL (ref 6.1–8.1)

## 2019-03-15 LAB — CBC WITH DIFFERENTIAL/PLATELET
Absolute Monocytes: 391 cells/uL (ref 200–950)
Basophils Absolute: 69 cells/uL (ref 0–200)
Basophils Relative: 1.6 %
Eosinophils Absolute: 39 cells/uL (ref 15–500)
Eosinophils Relative: 0.9 %
HCT: 43.3 % (ref 35.0–45.0)
Hemoglobin: 14.6 g/dL (ref 11.7–15.5)
Lymphs Abs: 1342 cells/uL (ref 850–3900)
MCH: 31.8 pg (ref 27.0–33.0)
MCHC: 33.7 g/dL (ref 32.0–36.0)
MCV: 94.3 fL (ref 80.0–100.0)
MPV: 10.2 fL (ref 7.5–12.5)
Monocytes Relative: 9.1 %
Neutro Abs: 2460 cells/uL (ref 1500–7800)
Neutrophils Relative %: 57.2 %
Platelets: 239 10*3/uL (ref 140–400)
RBC: 4.59 10*6/uL (ref 3.80–5.10)
RDW: 11.8 % (ref 11.0–15.0)
Total Lymphocyte: 31.2 %
WBC: 4.3 10*3/uL (ref 3.8–10.8)

## 2019-03-15 LAB — LIPID PANEL
Cholesterol: 275 mg/dL — ABNORMAL HIGH (ref <200)
HDL: 81 mg/dL (ref >=50)
LDL Cholesterol (Calc): 174 mg/dL (calc) — ABNORMAL HIGH
Non-HDL Cholesterol (Calc): 194 mg/dL (calc) — ABNORMAL HIGH (ref <130)
Total CHOL/HDL Ratio: 3.4 (calc) (ref <5.0)
Triglycerides: 86 mg/dL (ref <150)

## 2019-03-17 LAB — URINALYSIS, COMPLETE W/RFL CULTURE
Bilirubin Urine: NEGATIVE
Glucose, UA: NEGATIVE
Hgb urine dipstick: NEGATIVE
Hyaline Cast: NONE SEEN /LPF
Ketones, ur: NEGATIVE
Leukocyte Esterase: NEGATIVE
Nitrites, Initial: NEGATIVE
Protein, ur: NEGATIVE
RBC / HPF: NONE SEEN /HPF (ref 0–2)
Specific Gravity, Urine: 1.025 (ref 1.001–1.03)
pH: 6 (ref 5.0–8.0)

## 2019-03-17 LAB — URINE CULTURE
MICRO NUMBER:: 969134
SPECIMEN QUALITY:: ADEQUATE

## 2019-03-17 LAB — CULTURE INDICATED

## 2019-03-18 ENCOUNTER — Other Ambulatory Visit: Payer: Self-pay | Admitting: Women's Health

## 2019-03-18 MED ORDER — SULFAMETHOXAZOLE-TRIMETHOPRIM 800-160 MG PO TABS: 1.0000 | ORAL_TABLET | Freq: Two times a day (BID) | ORAL | 0 refills | Status: DC

## 2019-03-19 ENCOUNTER — Telehealth: Payer: Self-pay

## 2019-03-19 NOTE — Telephone Encounter (Signed)
Patient called to see what [place] recommend on her recent lab results. SHe said she had started to read them but got locked out of My Chart. I provided her with the My Chart customer support phone number and also read her [place]'s note regarding her lab results.  I asked her had she read the message from yesterday. She had not but knew medication had been sent and picked it up and started taking it. I read her that message as well.

## 2019-04-02 NOTE — Telephone Encounter (Signed)
I spoke with patient by phone on 03/19/19 and informed her of this.

## 2019-04-12 ENCOUNTER — Other Ambulatory Visit: Payer: Self-pay | Admitting: Family Medicine

## 2019-05-06 ENCOUNTER — Other Ambulatory Visit: Payer: Self-pay | Admitting: Family Medicine

## 2019-05-06 DIAGNOSIS — H8110 Benign paroxysmal vertigo, unspecified ear: Secondary | ICD-10-CM

## 2019-06-05 ENCOUNTER — Encounter: Payer: Self-pay | Admitting: Women's Health

## 2019-06-13 ENCOUNTER — Other Ambulatory Visit: Payer: Self-pay | Admitting: Family Medicine

## 2019-06-14 NOTE — Telephone Encounter (Signed)
Last OV 09/20/18 Last refill 04/27/19 #16 g/ 0 Next OV not scheduled

## 2019-07-10 ENCOUNTER — Other Ambulatory Visit: Payer: Self-pay | Admitting: Family Medicine

## 2019-07-22 ENCOUNTER — Other Ambulatory Visit: Payer: Self-pay | Admitting: Family Medicine

## 2019-09-01 ENCOUNTER — Other Ambulatory Visit: Payer: Self-pay | Admitting: Family Medicine

## 2019-09-03 NOTE — Telephone Encounter (Signed)
Dr Carollee Herter -- refilled pt's flonase and see that last OV here was 09/20/18.  When is pt due for next OV?

## 2019-09-03 NOTE — Telephone Encounter (Signed)
Stacey [name], Stacey R, DO  You 25 minutes ago (9:33 AM)   april

## 2019-09-18 NOTE — Telephone Encounter (Signed)
Spoke with pt and scheduled OV for 09/21/19 at 11am.

## 2019-09-20 ENCOUNTER — Other Ambulatory Visit: Payer: Self-pay

## 2019-09-21 ENCOUNTER — Ambulatory Visit (INDEPENDENT_AMBULATORY_CARE_PROVIDER_SITE_OTHER): Payer: No Typology Code available for payment source | Admitting: Family Medicine

## 2019-09-21 ENCOUNTER — Encounter: Payer: Self-pay | Admitting: Family Medicine

## 2019-09-21 VITALS — BP 104/68 | HR 81 | Temp 96.5°F | Ht 61.5 in | Wt 115.5 lb

## 2019-09-21 DIAGNOSIS — E039 Hypothyroidism, unspecified: Secondary | ICD-10-CM

## 2019-09-21 DIAGNOSIS — J302 Other seasonal allergic rhinitis: Secondary | ICD-10-CM

## 2019-09-21 DIAGNOSIS — E785 Hyperlipidemia, unspecified: Secondary | ICD-10-CM

## 2019-09-21 LAB — COMPREHENSIVE METABOLIC PANEL
ALT: 15 U/L (ref 0–35)
AST: 22 U/L (ref 0–37)
Albumin: 4.3 g/dL (ref 3.5–5.2)
Alkaline Phosphatase: 74 U/L (ref 39–117)
BUN: 20 mg/dL (ref 6–23)
CO2: 31 mEq/L (ref 19–32)
Calcium: 9.6 mg/dL (ref 8.4–10.5)
Chloride: 103 mEq/L (ref 96–112)
Creatinine, Ser: 0.73 mg/dL (ref 0.40–1.20)
GFR: 82.16 mL/min (ref >60.00)
Glucose, Bld: 92 mg/dL (ref 70–99)
Potassium: 4.3 mEq/L (ref 3.5–5.1)
Sodium: 140 mEq/L (ref 135–145)
Total Bilirubin: 0.6 mg/dL (ref 0.2–1.2)
Total Protein: 6.8 g/dL (ref 6.0–8.3)

## 2019-09-21 LAB — CBC WITH DIFFERENTIAL/PLATELET
Basophils Absolute: 0 10*3/uL (ref 0.0–0.1)
Basophils Relative: 1.4 % (ref 0.0–3.0)
Eosinophils Absolute: 0.1 10*3/uL (ref 0.0–0.7)
Eosinophils Relative: 1.5 % (ref 0.0–5.0)
HCT: 42.4 % (ref 36.0–46.0)
Hemoglobin: 14.5 g/dL (ref 12.0–15.0)
Lymphocytes Relative: 35.2 % (ref 12.0–46.0)
Lymphs Abs: 1.3 10*3/uL (ref 0.7–4.0)
MCHC: 34.1 g/dL (ref 30.0–36.0)
MCV: 93.1 fl (ref 78.0–100.0)
Monocytes Absolute: 0.3 10*3/uL (ref 0.1–1.0)
Monocytes Relative: 9.2 % (ref 3.0–12.0)
Neutro Abs: 1.9 10*3/uL (ref 1.4–7.7)
Neutrophils Relative %: 52.7 % (ref 43.0–77.0)
Platelets: 222 10*3/uL (ref 150.0–400.0)
RBC: 4.55 Mil/uL (ref 3.87–5.11)
RDW: 12.5 % (ref 11.5–15.5)
WBC: 3.6 10*3/uL — ABNORMAL LOW (ref 4.0–10.5)

## 2019-09-21 LAB — LIPID PANEL
Cholesterol: 253 mg/dL — ABNORMAL HIGH (ref 0–200)
HDL: 83.9 mg/dL (ref >39.00)
LDL Cholesterol: 158 mg/dL — ABNORMAL HIGH (ref 0–99)
NonHDL: 169.05
Total CHOL/HDL Ratio: 3
Triglycerides: 57 mg/dL (ref 0.0–149.0)
VLDL: 11.4 mg/dL (ref 0.0–40.0)

## 2019-09-21 LAB — TSH: TSH: 1.04 u[IU]/mL (ref 0.35–4.50)

## 2019-09-21 MED ORDER — FLUTICASONE PROPIONATE 50 MCG/ACT NA SUSP: NASAL | 3 refills | Status: DC

## 2019-09-21 NOTE — Assessment & Plan Note (Signed)
con't flonase and antihistamine  

## 2019-09-21 NOTE — Progress Notes (Signed)
Patient ID: Stacey [name], female    DOB: [birth year]  Age: 59 y.o. MRN: [id number]    Subjective:  Subjective  HPI Stacey [name] presents for f/u allergies and refills    She would also like her labs rechecked.  Labs reviewed from gyn  Review of Systems  Constitutional: Negative for appetite change, diaphoresis, fatigue and unexpected weight change.  Eyes: Negative for pain, redness and visual disturbance.  Respiratory: Negative for cough, chest tightness, shortness of breath and wheezing.   Cardiovascular: Negative for chest pain, palpitations and leg swelling.  Endocrine: Negative for cold intolerance, heat intolerance, polydipsia, polyphagia and polyuria.  Genitourinary: Negative for difficulty urinating, dysuria and frequency.  Neurological: Negative for dizziness, light-headedness, numbness and headaches.    History Past Medical History:  Diagnosis Date  . Hyperlipidemia   . 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 Pollen allergies   . Thyroid disease 2008   HYPOTHYROIDISM    She has a past surgical history that includes Cesarean section (1992) and Popliteal synovial cyst excision (Left, 1974).   Her family history includes Anuerysm in her maternal grandfather; Cancer (age of onset: 7) in her father; Celiac disease in her daughter; Diabetes in her daughter and daughter; Prostate cancer in an other family member.She reports that she has never smoked. She has never used smokeless tobacco. She reports that she does not drink alcohol or use drugs.  Current Outpatient Medications on File Prior to Visit  Medication Sig Dispense Refill  . ascorbic acid (VITAMIN C) 250 MG CHEW Chew 250 mg by mouth daily.    . cetirizine (ZYRTEC) 10 MG tablet Take 10 mg by mouth daily.    . Cholecalciferol (VITAMIN D) 1000 UNITS capsule Take 1,000 Units by mouth daily.      . Cyanocobalamin (B-12) 2500 MCG TABS Take 1 tablet by mouth daily.    Stacey [name] 75 MCG tablet TAKE 1 TABLET BY MOUTH ONCE DAILY BEFORE  BREAKFAST 90 tablet 0  . ibuprofen (ADVIL) 600 MG tablet Take 600 mg by mouth every 6 (six) hours as needed.    . meclizine (ANTIVERT) 25 MG tablet TAKE 1 TABLET BY MOUTH THREE TIMES DAILY AS NEEDED FOR DIZZINESS 90 tablet 0  . Misc Natural Products (AIRBORNE ELDERBERRY) CHEW Chew 1 tablet by mouth daily.    . Multiple Vitamin (MULTIVITAMIN) tablet Take 1 tablet by mouth daily.      . Omega-3 Fatty Acids (FISH OIL) 1000 MG CAPS Take by mouth.     No current facility-administered medications on file prior to visit.     Objective:  Objective  Physical Exam Vitals and nursing note reviewed.  Constitutional:      Appearance: She is well-developed.  HENT:     Head: Normocephalic and atraumatic.  Eyes:     Conjunctiva/sclera: Conjunctivae normal.  Neck:     Thyroid: No thyromegaly.     Vascular: No carotid bruit or JVD.  Cardiovascular:     Rate and Rhythm: Normal rate and regular rhythm.     Heart sounds: Normal heart sounds. No murmur.  Pulmonary:     Effort: Pulmonary effort is normal. No respiratory distress.     Breath sounds: Normal breath sounds. No wheezing or rales.  Chest:     Chest wall: No tenderness.  Musculoskeletal:     Cervical back: Normal range of motion and neck supple.  Neurological:     Mental Status: She is alert and oriented to person, place, and time.    BP  104/68 (BP Location: Left Arm, Patient Position: Sitting, Cuff Size: Normal)   Pulse 81   Temp (!) 96.5 F (35.8 C) (Temporal)   Ht 5' 1.5" (1.562 m)   Wt 115 lb 8 oz (52.4 kg)   LMP 07/02/2011   SpO2 98%   BMI 21.47 kg/m  Wt Readings from Last 3 Encounters:  09/21/19 115 lb 8 oz (52.4 kg)  03/14/19 119 lb 9.6 oz (54.3 kg)  07/06/18 121 lb (54.9 kg)     Lab Results  Component Value Date   WBC 4.3 03/14/2019   HGB 14.6 03/14/2019   HCT 43.3 03/14/2019   PLT 239 03/14/2019   GLUCOSE 97 03/14/2019   CHOL 275 (H) 03/14/2019   TRIG 86 03/14/2019   HDL 81 03/14/2019   LDLCALC 174 (H)  03/14/2019   ALT 14 03/14/2019   AST 25 03/14/2019   NA 140 03/14/2019   K 4.5 03/14/2019   CL 103 03/14/2019   CREATININE 0.73 03/14/2019   BUN 20 03/14/2019   CO2 32 03/14/2019   TSH 0.63 09/21/2018   HGBA1C 6.0 09/05/2015   MICROALBUR 0.7 12/13/2014    [place] Chest  Result Date: 05/29/2015 CLINICAL DATA:  C/o palpable hard painless lump over medial end of Rt clavicle x 2 days, patient had an xray of clavicle to eval. EXAM: CHEST ULTRASOUND COMPARISON:  None. FINDINGS: There is hypo echogenicity surrounding the medial margin of the right clavicle extending to the sternoclavicular joint. This is likely capsular hypertrophy. There may be a joint effusion. Echogenic foci are seen within this. This is asymmetric when compared to the left side. IMPRESSION: Abnormality surrounding the right clavicular head and right sternoclavicular joint. This is likely either capsular hypertrophy or capsular distention from a joint effusion. If further imaging is desired clinically, this would be best performed with MRI or CT. MRI would be the most sensitive and specific modality. Electronically Signed   By: Lajean Manes M.D.   On: 05/29/2015 16:00     Assessment & Plan:  Plan  I have discontinued Stacey [name]'s sulfamethoxazole-trimethoprim. I am also having her maintain her Vitamin D, multivitamin, cetirizine, B-12, Fish Oil, ascorbic acid, ibuprofen, meclizine, Euthyrox, Airborne Elderberry, and fluticasone.  Meds ordered this encounter  Medications  . fluticasone (FLONASE) 50 MCG/ACT nasal spray    Sig: Use 2 spray(s) in each nostril once daily    Dispense:  16 g    Refill:  3    Problem List Items Addressed This Visit      Unprioritized   Hyperlipidemia - Primary    Encouraged heart healthy diet, increase exercise, avoid trans fats, consider a krill oil cap daily      Relevant Orders   Lipid panel   CBC with Differential/Platelet   Comprehensive metabolic panel   Hypothyroidism    Check  labs  con't meds       Relevant Orders   TSH   Seasonal allergies    con't flonase and antihistamine       Relevant Medications   fluticasone (FLONASE) 50 MCG/ACT nasal spray      Follow-up: Return in about 1 year (around 09/20/2020), or if symptoms worsen or fail to improve, for annual exam, fasting.  Ann Held, DO

## 2019-09-21 NOTE — Assessment & Plan Note (Signed)
Check labs con't meds 

## 2019-09-21 NOTE — Patient Instructions (Signed)
Allergies, Adult An allergy is when your body's defense system (immune system) overreacts to an otherwise harmless substance (allergen) that you breathe in or eat or something that touches your skin. When you come into contact with something that you are allergic to, your immune system produces certain proteins (antibodies). These proteins cause cells to release chemicals (histamines) that trigger the symptoms of an allergic reaction. Allergies often affect the nasal passages (allergic rhinitis), eyes (allergic conjunctivitis), skin (atopic dermatitis), and stomach. Allergies can be mild or severe. Allergies cannot spread from person to person (are not contagious). They can develop at any age and may be outgrown. What increases the risk? You may be at greater risk of allergies if other people in your family have allergies. What are the signs or symptoms? Symptoms depend on what type of allergy you have. They may include:  Runny, stuffy nose.  Sneezing.  Itchy mouth, ears, or throat.  Postnasal drip.  Sore throat.  Itchy, red, watery, or puffy eyes.  Skin rash or hives.  Stomach pain.  Vomiting.  Diarrhea.  Bloating.  Wheezing or coughing. People with a severe allergy to food, medicine, or an insect bite may have a life-threatening allergic reaction (anaphylaxis). Symptoms of anaphylaxis include:  Hives.  Itching.  Flushed face.  Swollen lips, tongue, or mouth.  Tight or swollen throat.  Chest pain or tightness in the chest.  Trouble breathing or shortness of breath.  Rapid heartbeat.  Dizziness or fainting.  Vomiting.  Diarrhea.  Pain in the abdomen. How is this diagnosed? This condition is diagnosed based on:  Your symptoms.  Your family and medical history.  A physical exam. You may need to see a health care provider who specializes in treating allergies (allergist). You may also have tests, including:  Skin tests to see which allergens are causing  your symptoms, such as: ? Skin prick test. In this test, your skin is pricked with a tiny needle and exposed to small amounts of possible allergens to see if your skin reacts. ? Intradermal skin test. In this test, a small amount of allergen is injected under your skin to see if your skin reacts. ? Patch test. In this test, a small amount of allergen is placed on your skin and then your skin is covered with a bandage. Your health care provider will check your skin after a couple of days to see if a rash has developed.  Blood tests.  Challenges tests. In this test, you inhale a small amount of allergen by mouth to see if you have an allergic reaction. You may also be asked to:  Keep a food diary. A food diary is a record of all the foods and drinks you have in a day and any symptoms you experience.  Practice an elimination diet. An elimination diet involves eliminating specific foods from your diet and then adding them back in one by one to find out if a certain food causes an allergic reaction. How is this treated? Treatment for allergies depends on your symptoms. Treatment may include:  Cold compresses to soothe itching and swelling.  Eye drops.  Nasal sprays.  Using a saline spray or container (neti pot) to flush out the nose (nasal irrigation). These methods can help clear away mucus and keep the nasal passages moist.  Using a humidifier.  Oral antihistamines or other medicines to block allergic reaction and inflammation.  Skin creams to treat rashes or itching.  Diet changes to eliminate food allergy triggers.  Repeated exposure to tiny amounts of allergens to build up a tolerance and prevent future allergic reactions (immunotherapy). These include: °? Allergy shots. °? Oral treatment. This involves taking small doses of an allergen under the tongue (sublingual immunotherapy). °· Emergency epinephrine injection (auto-injector) in case of an allergic emergency. This is a  self-injectable, pre-measured medicine that must be given within the first few minutes of a serious allergic reaction. °Follow these instructions at home: ° °  ° °  ° °· Avoid known allergens whenever possible. °· If you suffer from airborne allergens, wash out your nose daily. You can do this with a saline spray or a neti pot to flush out your nose (nasal irrigation). °· Take over-the-counter and prescription medicines only as told by your health care provider. °· Keep all follow-up visits as told by your health care provider. This is important. °· If you are at risk of a severe allergic reaction (anaphylaxis), keep your auto-injector with you at all times. °· If you have ever had anaphylaxis, wear a medical alert bracelet or necklace that states you have a severe allergy. °Contact a health care provider if: °· Your symptoms do not improve with treatment. °Get help right away if: °· You have symptoms of anaphylaxis, such as: °? Swollen mouth, tongue, or throat. °? Pain or tightness in your chest. °? Trouble breathing or shortness of breath. °? Dizziness or fainting. °? Severe abdominal pain, vomiting, or diarrhea. °This information is not intended to replace advice given to you by your health care provider. Make sure you discuss any questions you have with your health care provider. °Document Revised: 08/17/2017 Document Reviewed: 12/10/2015 °Elsevier Patient Education © 2020 [employer] ° °

## 2019-09-21 NOTE — Assessment & Plan Note (Signed)
Encouraged heart healthy diet, increase exercise, avoid trans fats, consider a krill oil cap daily 

## 2019-09-24 ENCOUNTER — Telehealth: Payer: Self-pay

## 2019-09-24 NOTE — Telephone Encounter (Signed)
Pt advised of lab work.  

## 2019-09-24 NOTE — Telephone Encounter (Signed)
Patient called in to see if Dr. Etter Sjogren or the nurse can give her a call to discuss her test results. Please give the patient a call back at [phone]

## 2019-10-03 ENCOUNTER — Other Ambulatory Visit: Payer: Self-pay | Admitting: Family Medicine

## 2020-01-04 ENCOUNTER — Other Ambulatory Visit: Payer: Self-pay | Admitting: Family Medicine

## 2020-01-04 DIAGNOSIS — H8110 Benign paroxysmal vertigo, unspecified ear: Secondary | ICD-10-CM

## 2020-03-19 ENCOUNTER — Ambulatory Visit (INDEPENDENT_AMBULATORY_CARE_PROVIDER_SITE_OTHER): Payer: Self-pay | Admitting: Nurse Practitioner

## 2020-03-19 ENCOUNTER — Other Ambulatory Visit: Payer: Self-pay

## 2020-03-19 ENCOUNTER — Encounter: Payer: Self-pay | Admitting: Nurse Practitioner

## 2020-03-19 VITALS — BP 100/70 | Ht 61.0 in | Wt 122.0 lb

## 2020-03-19 DIAGNOSIS — Z01419 Encounter for gynecological examination (general) (routine) without abnormal findings: Secondary | ICD-10-CM

## 2020-03-19 NOTE — Patient Instructions (Signed)

## 2020-03-19 NOTE — Progress Notes (Signed)
   Stacey [name] 1961-04-03 [id number]   History:  59 y.o. G2P2 presents for annual exam without GYN complaints. Postmenopausal - no HRT, no bleeding. Normal pap and mammogram history. Hypothyroidism managed by PCP.   Gynecologic History Patient's last menstrual period was 07/02/2011.   Contraception: post menopausal status Last Pap: 03/04/2017. Results were: normal Last mammogram: 06/05/2019. Results were: normal Last colonoscopy: 01/02/2014. Results were: normal, 10 year repeat recommended   Past medical history, past surgical history, family history and social history were all reviewed and documented in the EPIC chart.  ROS:  A ROS was performed and pertinent positives and negatives are included.  Exam:  Vitals:   03/19/20 1111  BP: 100/70  Weight: 122 lb (55.3 kg)  Height: 5\' 1"  (1.549 m)   Body mass index is 23.05 kg/m.  General appearance:  Normal Thyroid:  Symmetrical, normal in size, without palpable masses or nodularity. Respiratory  Auscultation:  Clear without wheezing or rhonchi Cardiovascular  Auscultation:  Regular rate, without rubs, murmurs or gallops  Edema/varicosities:  Not grossly evident Abdominal  Soft,nontender, without masses, guarding or rebound.  Liver/spleen:  No organomegaly noted  Hernia:  None appreciated  Skin  Inspection:  Grossly normal   Breasts: Examined lying and sitting.   Right: Without masses, retractions, discharge or axillary adenopathy.   Left: Without masses, retractions, discharge or axillary adenopathy. Gentitourinary   Inguinal/mons:  Normal without inguinal adenopathy  External genitalia:  Normal  BUS/Urethra/Skene's glands:  Normal  Vagina:  Atrophic changes  Cervix:  Flush with vaginal wall  Uterus:  Normal in size, shape and contour.  Midline and mobile  Adnexa/parametria:     Rt: Without masses or tenderness.   Lt: Without masses or tenderness.  Anus and perineum: Normal  Digital rectal exam: Normal sphincter  tone without palpated masses or tenderness  Assessment/Plan:  59 y.o. G2P2 for annual exam.   Well female exam with routine gynecological exam - Education provided on SBEs, importance of preventative screenings, current guidelines, high calcium diet, regular exercise, and multivitamin daily. Labs with PCP.  Screening for cervical cancer -normal Pap history.  Will repeat at 5-year interval per guidelines.  Screening for breast cancer -normal mammogram history.  Normal breast exam today.  Screening for colon cancer -2015, normal.  10-year repeat recommended.  Follow-up in 1 year for annual.       Tamela Gammon [facility], 11:18 AM 03/19/2020

## 2020-04-01 ENCOUNTER — Other Ambulatory Visit: Payer: Self-pay | Admitting: Family Medicine

## 2020-04-07 ENCOUNTER — Other Ambulatory Visit: Payer: Self-pay | Admitting: Family Medicine

## 2020-04-07 DIAGNOSIS — J302 Other seasonal allergic rhinitis: Secondary | ICD-10-CM

## 2020-06-04 ENCOUNTER — Other Ambulatory Visit: Payer: Self-pay | Admitting: Family Medicine

## 2020-06-04 DIAGNOSIS — H8110 Benign paroxysmal vertigo, unspecified ear: Secondary | ICD-10-CM

## 2020-06-05 ENCOUNTER — Other Ambulatory Visit: Payer: Self-pay | Admitting: Family Medicine

## 2020-09-18 ENCOUNTER — Other Ambulatory Visit: Payer: Self-pay | Admitting: Family Medicine

## 2020-09-18 DIAGNOSIS — J302 Other seasonal allergic rhinitis: Secondary | ICD-10-CM

## 2020-10-09 LAB — HM MAMMOGRAPHY

## 2020-12-11 ENCOUNTER — Other Ambulatory Visit: Payer: Self-pay | Admitting: Family Medicine

## 2021-01-18 ENCOUNTER — Other Ambulatory Visit: Payer: Self-pay | Admitting: Family Medicine

## 2021-01-18 DIAGNOSIS — H8110 Benign paroxysmal vertigo, unspecified ear: Secondary | ICD-10-CM

## 2021-01-20 ENCOUNTER — Other Ambulatory Visit: Payer: Self-pay | Admitting: Family Medicine

## 2021-01-26 ENCOUNTER — Other Ambulatory Visit: Payer: Self-pay | Admitting: Family Medicine

## 2021-01-27 ENCOUNTER — Telehealth: Payer: Self-pay | Admitting: Family Medicine

## 2021-01-27 MED ORDER — LEVOTHYROXINE SODIUM 75 MCG PO TABS
75.0000 ug | ORAL_TABLET | Freq: Every day | ORAL | 0 refills | Status: DC
Start: 2021-01-27 — End: 2021-03-05

## 2021-01-27 NOTE — Telephone Encounter (Signed)
Medication: EUTHYROX 75 MCG tablet [id number]  Pt has an appt scheduled for 02/26/21 for her Annual cpe she is out and need med before appt  Has the patient contacted their pharmacy? Yes.   (If no, request that the patient contact the pharmacy for the refill.) (If yes, when and what did the pharmacy advise?)  Preferred Pharmacy (with phone number or street name):  [facility] ([address]), [place]  [id number] [place]) [place] [address]  Phone:  [phone]  Fax:  [phone]  Agent: Please be advised that RX refills may take up to 3 business days. We ask that you follow-up with your pharmacy.

## 2021-01-27 NOTE — Telephone Encounter (Signed)
Refill sent.

## 2021-02-25 ENCOUNTER — Other Ambulatory Visit: Payer: Self-pay

## 2021-02-26 ENCOUNTER — Encounter: Payer: Self-pay | Admitting: Family Medicine

## 2021-02-26 ENCOUNTER — Ambulatory Visit (INDEPENDENT_AMBULATORY_CARE_PROVIDER_SITE_OTHER): Payer: Self-pay | Admitting: Family Medicine

## 2021-02-26 VITALS — BP 113/79 | HR 76 | Temp 98.1°F | Resp 16 | Wt 120.0 lb

## 2021-02-26 DIAGNOSIS — Z0000 Encounter for general adult medical examination without abnormal findings: Secondary | ICD-10-CM

## 2021-02-26 NOTE — Progress Notes (Signed)
Subjective:   By signing my name below, I, Larwance Rote, attest that this documentation has been prepared under the direction and in the presence of Ann Held, DO  02/26/2021.     Patient ID: Stacey [name], female    DOB: 06-25-1960, 60 y.o.   MRN: [id number]  Chief Complaint  Patient presents with   Annual Exam    HPI Patient is in today for a comprehensive physical exam.  She is requesting a refill today for Levothyroxine 75 mg for hypothyroidism. She is currently living in her friend house. She is also looking for a new house. She is seeing her gynecologist in October.  She would also like her labs to be done today. She has no acute complain today.   She denies having any fever, ear pain, muscle pain, joint pain, new moles, congestion, sinus pain, sore throat, eye pain, chest pain, palpations, cough, SOB, wheezing, n/v/d, constipation, blood in stool, dysuria, frequency, hematuria, or headaches at this time.   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   Pollen allergies    Thyroid disease 2008   HYPOTHYROIDISM    Past Surgical History:  Procedure Laterality Date   CESAREAN SECTION  1992   POPLITEAL SYNOVIAL CYST EXCISION Left 1974    Family History  Problem Relation Age of Onset   Cancer Father 32       prostate   Liver cancer Father    Anuerysm Maternal Grandfather    Diabetes Daughter    Celiac disease Daughter    Diabetes Daughter    Prostate cancer Other    Colon cancer Neg Hx     Social History   Socioeconomic History   Marital status: Married    Spouse name: Not on file   Number of children: 2   Years of education: Not on file   Highest education level: Not on file  Occupational History   Occupation: Primary school teacher: [employer]  Tobacco Use   Smoking status: Never   Smokeless tobacco: Never  Vaping Use   Vaping Use: Never used  Substance and Sexual Activity   Alcohol  use: No   Drug use: No   Sexual activity: Yes    Partners: Male    Birth control/protection: Condom  Other Topics Concern   Not on file  Social History Narrative   Not on file   Social Determinants of Health   Financial Resource Strain: Not on file  Food Insecurity: Not on file  Transportation Needs: Not on file  Physical Activity: Not on file  Stress: Not on file  Social Connections: Not on file  Intimate Partner Violence: Not on file    Outpatient Medications Prior to Visit  Medication Sig Dispense Refill   ascorbic acid (VITAMIN C) 250 MG CHEW Chew 250 mg by mouth daily.     cetirizine (ZYRTEC) 10 MG tablet Take 10 mg by mouth daily.     Cholecalciferol (VITAMIN D) 1000 UNITS capsule Take 1,000 Units by mouth daily.       Cyanocobalamin (B-12) 2500 MCG TABS Take 1 tablet by mouth daily.     fluticasone (FLONASE) 50 MCG/ACT nasal spray Place 2 sprays into both nostrils daily. 16 g 5   ibuprofen (ADVIL) 600 MG tablet Take 600 mg by mouth every 6 (six) hours as needed.     levothyroxine (EUTHYROX) 75 MCG tablet Take 1 tablet (75 mcg  total) by mouth daily before breakfast. 30 tablet 0   meclizine (ANTIVERT) 25 MG tablet TAKE 1 TABLET BY MOUTH THREE TIMES DAILY AS NEEDED FOR DIZZINESS 90 tablet 0   Misc Natural Products (AIRBORNE ELDERBERRY) CHEW Chew 1 tablet by mouth daily.     Multiple Vitamin (MULTIVITAMIN) tablet Take 1 tablet by mouth daily.       Omega-3 Fatty Acids (FISH OIL) 1000 MG CAPS Take by mouth.     No facility-administered medications prior to visit.    Allergies  Allergen Reactions   Other     TREES, POLLEN, DUST, MOLD AND ANIMALS    Review of Systems  Constitutional:  Negative for fever.  HENT:  Negative for congestion, ear pain, sinus pain and sore throat.   Eyes:  Negative for pain.  Respiratory:  Negative for shortness of breath and wheezing.   Cardiovascular:  Negative for chest pain and palpitations.  Gastrointestinal:  Negative for blood in  stool, constipation, diarrhea, nausea and vomiting.  Genitourinary:  Negative for dysuria, frequency and hematuria.  Musculoskeletal:  Negative for joint pain and myalgias.  Skin:        (-) new moles  Neurological:  Negative for headaches.      Objective:    Physical Exam Constitutional:      General: She is not in acute distress.    Appearance: Normal appearance. She is not ill-appearing.  HENT:     Head: Normocephalic and atraumatic.     Right Ear: Tympanic membrane, ear canal and external ear normal.     Left Ear: Tympanic membrane, ear canal and external ear normal.  Eyes:     Extraocular Movements: Extraocular movements intact.     Pupils: Pupils are equal, round, and reactive to light.  Cardiovascular:     Rate and Rhythm: Normal rate and regular rhythm.     Pulses: Normal pulses.     Heart sounds: Normal heart sounds. No murmur heard.   No gallop.  Pulmonary:     Effort: Pulmonary effort is normal. No respiratory distress.     Breath sounds: Normal breath sounds. No wheezing or rales.  Abdominal:     General: There is no distension.     Palpations: Abdomen is soft.     Tenderness: There is no abdominal tenderness. There is no guarding.  Lymphadenopathy:     Cervical: No cervical adenopathy.  Skin:    General: Skin is warm and dry.  Neurological:     Mental Status: She is alert and oriented to person, place, and time.  Psychiatric:        Behavior: Behavior normal.        Judgment: Judgment normal.    BP 113/79 (BP Location: Right Arm, Patient Position: Sitting, Cuff Size: Small)   Pulse 76   Temp 98.1 F (36.7 C) (Oral)   Resp 16   Wt 120 lb (54.4 kg)   LMP 07/02/2011   SpO2 98%   BMI 22.67 kg/m  Wt Readings from Last 3 Encounters:  02/26/21 120 lb (54.4 kg)  03/19/20 122 lb (55.3 kg)  09/21/19 115 lb 8 oz (52.4 kg)    Diabetic Foot Exam - Simple   No data filed    Lab Results  Component Value Date   WBC 3.6 (L) 09/21/2019   HGB 14.5  09/21/2019   HCT 42.4 09/21/2019   PLT 222.0 09/21/2019   GLUCOSE 92 09/21/2019   CHOL 253 (H) 09/21/2019   TRIG 57.0 09/21/2019  HDL 83.90 09/21/2019   LDLCALC 158 (H) 09/21/2019   ALT 15 09/21/2019   AST 22 09/21/2019   NA 140 09/21/2019   K 4.3 09/21/2019   CL 103 09/21/2019   CREATININE 0.73 09/21/2019   BUN 20 09/21/2019   CO2 31 09/21/2019   TSH 1.04 09/21/2019   HGBA1C 6.0 09/05/2015   MICROALBUR 0.7 12/13/2014    Lab Results  Component Value Date   TSH 1.04 09/21/2019   Lab Results  Component Value Date   WBC 3.6 (L) 09/21/2019   HGB 14.5 09/21/2019   HCT 42.4 09/21/2019   MCV 93.1 09/21/2019   PLT 222.0 09/21/2019   Lab Results  Component Value Date   NA 140 09/21/2019   K 4.3 09/21/2019   CO2 31 09/21/2019   GLUCOSE 92 09/21/2019   BUN 20 09/21/2019   CREATININE 0.73 09/21/2019   BILITOT 0.6 09/21/2019   ALKPHOS 74 09/21/2019   AST 22 09/21/2019   ALT 15 09/21/2019   PROT 6.8 09/21/2019   ALBUMIN 4.3 09/21/2019   CALCIUM 9.6 09/21/2019   ANIONGAP 8 01/30/2015   GFR 82.16 09/21/2019   Lab Results  Component Value Date   CHOL 253 (H) 09/21/2019   Lab Results  Component Value Date   HDL 83.90 09/21/2019   Lab Results  Component Value Date   LDLCALC 158 (H) 09/21/2019   Lab Results  Component Value Date   TRIG 57.0 09/21/2019   Lab Results  Component Value Date   CHOLHDL 3 09/21/2019   Lab Results  Component Value Date   HGBA1C 6.0 09/05/2015   Mammogram- Last completed on 10/09/2020.  Repeat in 1 year.   Colonoscopy- Last completed in 01/02/2014. Repeat in 10 years.  Pap smear- Last completed on 03/04/2017. Results are normal. Repeat in 2 years.      Assessment & Plan:   Problem List Items Addressed This Visit   None Visit Diagnoses     Preventative health care    -  Primary   Relevant Orders   Lipid panel   CBC with Differential/Platelet   Comprehensive metabolic panel   TSH         No orders of the defined  types were placed in this encounter.   IAnn Held, DO, personally preformed the services described in this documentation.  All medical record entries made by the scribe were at my direction and in my presence.  I have reviewed the chart and discharge instructions (if applicable) and agree that the record reflects my personal performance and is accurate and complete. 02/14/2021.   I,Savera Zaman,acting as a Education administrator for [employer], DO.,have documented all relevant documentation on the behalf of Ann Held, DO,as directed by  Ann Held, DO while in the presence of Ann Held, DO.    Ann Held, DO

## 2021-02-26 NOTE — Patient Instructions (Signed)
Preventive Care 40-58 Years Old, Female Preventive care refers to lifestyle choices and visits with your health care provider that can promote health and wellness. This includes: A yearly physical exam. This is also called an annual wellness visit. Regular dental and eye exams. Immunizations. Screening for certain conditions. Healthy lifestyle choices, such as: Eating a healthy diet. Getting regular exercise. Not using drugs or products that contain nicotine and tobacco. Limiting alcohol use. What can I expect for my preventive care visit? Physical exam Your health care provider will check your: Height and weight. These may be used to calculate your BMI (body mass index). BMI is a measurement that tells if you are at a healthy weight. Heart rate and blood pressure. Body temperature. Skin for abnormal spots. Counseling Your health care provider may ask you questions about your: Past medical problems. Family's medical history. Alcohol, tobacco, and drug use. Emotional well-being. Home life and relationship well-being. Sexual activity. Diet, exercise, and sleep habits. Work and work environment. Access to firearms. Method of birth control. Menstrual cycle. Pregnancy history. What immunizations do I need? Vaccines are usually given at various ages, according to a schedule. Your health care provider will recommend vaccines for you based on your age, medical history, and lifestyle or other factors, such as travel or where you work. What tests do I need? Blood tests Lipid and cholesterol levels. These may be checked every 5 years, or more often if you are over 50 years old. Hepatitis C test. Hepatitis B test. Screening Lung cancer screening. You may have this screening every year starting at age 55 if you have a 30-pack-year history of smoking and currently smoke or have quit within the past 15 years. Colorectal cancer screening. All adults should have this screening starting at  age 50 and continuing until age 75. Your health care provider may recommend screening at age 45 if you are at increased risk. You will have tests every 1-10 years, depending on your results and the type of screening test. Diabetes screening. This is done by checking your blood sugar (glucose) after you have not eaten for a while (fasting). You may have this done every 1-3 years. Mammogram. This may be done every 1-2 years. Talk with your health care provider about when you should start having regular mammograms. This may depend on whether you have a family history of breast cancer. BRCA-related cancer screening. This may be done if you have a family history of breast, ovarian, tubal, or peritoneal cancers. Pelvic exam and Pap test. This may be done every 3 years starting at age 21. Starting at age 30, this may be done every 5 years if you have a Pap test in combination with an HPV test. Other tests STD (sexually transmitted disease) testing, if you are at risk. Bone density scan. This is done to screen for osteoporosis. You may have this scan if you are at high risk for osteoporosis. Talk with your health care provider about your test results, treatment options, and if necessary, the need for more tests. Follow these instructions at home: Eating and drinking  Eat a diet that includes fresh fruits and vegetables, whole grains, lean protein, and low-fat dairy products. Take vitamin and mineral supplements as recommended by your health care provider. Do not drink alcohol if: Your health care provider tells you not to drink. You are pregnant, may be pregnant, or are planning to become pregnant. If you drink alcohol: Limit how much you have to 0-1 drink a day. Be   aware of how much alcohol is in your drink. In the [place] one drink equals one 12 oz bottle of beer (355 mL), one 5 oz glass of wine (148 mL), or one 1 oz glass of hard liquor (44 mL). Lifestyle Take daily care of your teeth and  gums. Brush your teeth every morning and night with fluoride toothpaste. Floss one time each day. Stay active. Exercise for at least 30 minutes 5 or more days each week. Do not use any products that contain nicotine or tobacco, such as cigarettes, e-cigarettes, and chewing tobacco. If you need help quitting, ask your health care provider. Do not use drugs. If you are sexually active, practice safe sex. Use a condom or other form of protection to prevent STIs (sexually transmitted infections). If you do not wish to become pregnant, use a form of birth control. If you plan to become pregnant, see your health care provider for a prepregnancy visit. If told by your health care provider, take low-dose aspirin daily starting at age 63. Find healthy ways to cope with stress, such as: Meditation, yoga, or listening to music. Journaling. Talking to a trusted person. Spending time with friends and family. Safety Always wear your seat belt while driving or riding in a vehicle. Do not drive: If you have been drinking alcohol. Do not ride with someone who has been drinking. When you are tired or distracted. While texting. Wear a helmet and other protective equipment during sports activities. If you have firearms in your house, make sure you follow all gun safety procedures. What's next? Visit your health care provider once a year for an annual wellness visit. Ask your health care provider how often you should have your eyes and teeth checked. Stay up to date on all vaccines. This information is not intended to replace advice given to you by your health care provider. Make sure you discuss any questions you have with your health care provider. Document Revised: 08/01/2020 Document Reviewed: 02/02/2018 Elsevier Patient Education  2022 [employer].

## 2021-02-26 NOTE — Assessment & Plan Note (Signed)
ghm utd Check labs  See avs  

## 2021-02-27 LAB — CBC WITH DIFFERENTIAL/PLATELET
Basophils Absolute: 0.1 10*3/uL (ref 0.0–0.1)
Basophils Relative: 1.2 % (ref 0.0–3.0)
Eosinophils Absolute: 0 10*3/uL (ref 0.0–0.7)
Eosinophils Relative: 1.1 % (ref 0.0–5.0)
HCT: 42.6 % (ref 36.0–46.0)
Hemoglobin: 14.4 g/dL (ref 12.0–15.0)
Lymphocytes Relative: 30.4 % (ref 12.0–46.0)
Lymphs Abs: 1.4 10*3/uL (ref 0.7–4.0)
MCHC: 33.9 g/dL (ref 30.0–36.0)
MCV: 93.6 fl (ref 78.0–100.0)
Monocytes Absolute: 0.4 10*3/uL (ref 0.1–1.0)
Monocytes Relative: 8.8 % (ref 3.0–12.0)
Neutro Abs: 2.6 10*3/uL (ref 1.4–7.7)
Neutrophils Relative %: 58.5 % (ref 43.0–77.0)
Platelets: 235 10*3/uL (ref 150.0–400.0)
RBC: 4.54 Mil/uL (ref 3.87–5.11)
RDW: 12 % (ref 11.5–15.5)
WBC: 4.5 10*3/uL (ref 4.0–10.5)

## 2021-02-27 LAB — COMPREHENSIVE METABOLIC PANEL
ALT: 13 U/L (ref 0–35)
AST: 20 U/L (ref 0–37)
Albumin: 4.4 g/dL (ref 3.5–5.2)
Alkaline Phosphatase: 74 U/L (ref 39–117)
BUN: 21 mg/dL (ref 6–23)
CO2: 31 mEq/L (ref 19–32)
Calcium: 9.7 mg/dL (ref 8.4–10.5)
Chloride: 101 mEq/L (ref 96–112)
Creatinine, Ser: 0.75 mg/dL (ref 0.40–1.20)
GFR: 87.72 mL/min (ref >60.00)
Glucose, Bld: 83 mg/dL (ref 70–99)
Potassium: 4.1 mEq/L (ref 3.5–5.1)
Sodium: 139 mEq/L (ref 135–145)
Total Bilirubin: 0.8 mg/dL (ref 0.2–1.2)
Total Protein: 6.9 g/dL (ref 6.0–8.3)

## 2021-02-27 LAB — TSH: TSH: 0.54 u[IU]/mL (ref 0.35–5.50)

## 2021-02-27 LAB — LIPID PANEL
Cholesterol: 261 mg/dL — ABNORMAL HIGH (ref 0–200)
HDL: 80.4 mg/dL (ref >39.00)
LDL Cholesterol: 170 mg/dL — ABNORMAL HIGH (ref 0–99)
NonHDL: 180.1
Total CHOL/HDL Ratio: 3
Triglycerides: 53 mg/dL (ref 0.0–149.0)
VLDL: 10.6 mg/dL (ref 0.0–40.0)

## 2021-03-05 ENCOUNTER — Other Ambulatory Visit: Payer: Self-pay

## 2021-03-05 ENCOUNTER — Telehealth: Payer: Self-pay | Admitting: Family Medicine

## 2021-03-05 MED ORDER — LEVOTHYROXINE SODIUM 75 MCG PO TABS: 75.0000 ug | ORAL_TABLET | Freq: Every day | ORAL | 0 refills | Status: DC

## 2021-03-05 NOTE — Telephone Encounter (Signed)
Medication: levothyroxine (EUTHYROX) 75 MCG tablet  Has the patient contacted their pharmacy? No. (If no, request that the patient contact the pharmacy for the refill.) (If yes, when and what did the pharmacy advise?)  Preferred Pharmacy (with phone number or street name):  [facility] ([address]), [place]  [address] ([place]) [place] [address]  Phone:  [phone]  Fax:  [phone]

## 2021-03-05 NOTE — Telephone Encounter (Signed)
Medication sent in already and patient confirmed.

## 2021-03-20 ENCOUNTER — Other Ambulatory Visit: Payer: Self-pay

## 2021-03-20 ENCOUNTER — Ambulatory Visit (INDEPENDENT_AMBULATORY_CARE_PROVIDER_SITE_OTHER): Payer: Self-pay | Admitting: Nurse Practitioner

## 2021-03-20 ENCOUNTER — Other Ambulatory Visit (HOSPITAL_COMMUNITY)
Admission: RE | Admit: 2021-03-20 | Discharge: 2021-03-20 | Disposition: A | Discharge status: Home / Self Care | Payer: Self-pay | Source: Ambulatory Visit | Attending: Nurse Practitioner | Admitting: Nurse Practitioner

## 2021-03-20 ENCOUNTER — Encounter: Payer: Self-pay | Admitting: Nurse Practitioner

## 2021-03-20 VITALS — BP 122/76 | Ht 61.0 in | Wt 123.0 lb

## 2021-03-20 DIAGNOSIS — Z01419 Encounter for gynecological examination (general) (routine) without abnormal findings: Secondary | ICD-10-CM

## 2021-03-20 DIAGNOSIS — Z780 Asymptomatic menopausal state: Secondary | ICD-10-CM

## 2021-03-20 NOTE — Progress Notes (Signed)
   Stacey [name] 12/09/1960 [id number]   History:  60 y.o. G2P2 presents for annual exam without GYN complaints. Postmenopausal - no HRT, no bleeding. Normal pap and mammogram history. Hypothyroidism managed by PCP.   Gynecologic History Patient's last menstrual period was 07/02/2011.   Contraception: post menopausal status Sexually active: Yes  Health maintenance Last Pap: 03/04/2017. Results were: normal Last mammogram: 10/09/2020. Results were: normal Last colonoscopy: 01/02/2014. Results were: normal, 10 year recall Last Dexa: Not indicated  Past medical history, past surgical history, family history and social history were all reviewed and documented in the EPIC chart.  ROS:  A ROS was performed and pertinent positives and negatives are included.  Exam:  Vitals:   03/20/21 1452  BP: 122/76  Weight: 123 lb (55.8 kg)  Height: 5\' 1"  (1.549 m)   Body mass index is 23.24 kg/m.  General appearance:  Normal Thyroid:  Symmetrical, normal in size, without palpable masses or nodularity. Respiratory  Auscultation:  Clear without wheezing or rhonchi Cardiovascular  Auscultation:  Regular rate, without rubs, murmurs or gallops  Edema/varicosities:  Not grossly evident Abdominal  Soft,nontender, without masses, guarding or rebound.  Liver/spleen:  No organomegaly noted  Hernia:  None appreciated  Skin  Inspection:  Grossly normal   Breasts: Examined lying and sitting.   Right: Without masses, retractions, discharge or axillary adenopathy.   Left: Without masses, retractions, discharge or axillary adenopathy. Genitourinary   Inguinal/mons:  Normal without inguinal adenopathy  External genitalia:  Normal appearing vulva with no masses, tenderness, or lesions  BUS/Urethra/Skene's glands:  Normal  Vagina:  Normal appearing with normal color and discharge, no lesions  Cervix:  Normal appearing without discharge or lesion. Stenotic  Uterus:  Normal in size, shape and contour.   Midline and mobile, nontender  Adnexa/parametria:     Rt: Normal in size, without masses or tenderness.   Lt: Normal in size, without masses or tenderness.  Anus and perineum: Normal  Digital rectal exam: Normal sphincter tone without palpated masses or tenderness  Patient informed chaperone available to be present for breast and pelvic exam. Patient has requested no chaperone to be present. Patient has been advised what will be completed during breast and pelvic exam.   Assessment/Plan:  60 y.o. G2P2 for annual exam.   Well female exam with routine gynecological exam - Education provided on SBEs, importance of preventative screenings, current guidelines, high calcium diet, regular exercise, and multivitamin daily. Labs with PCP.  Screening for cervical cancer -normal Pap history.  Pap today.   Screening for breast cancer - Normal mammogram history.  Continue annual screenings. Normal breast exam today.  Screening for colon cancer - 2015 colonoscopy. Will repeat at interval per GI's recommended interval.  Follow-up in 1 year for annual.       Tamela Gammon [facility], 3:06 PM 03/20/2021

## 2021-03-23 LAB — CYTOLOGY - PAP
Adequacy: ABSENT
Comment: NEGATIVE
Diagnosis: NEGATIVE
High risk HPV: NEGATIVE

## 2021-05-20 ENCOUNTER — Other Ambulatory Visit: Payer: Self-pay | Admitting: Family Medicine

## 2021-05-20 DIAGNOSIS — J302 Other seasonal allergic rhinitis: Secondary | ICD-10-CM

## 2021-05-21 ENCOUNTER — Other Ambulatory Visit: Payer: Self-pay | Admitting: Family Medicine

## 2021-07-01 ENCOUNTER — Other Ambulatory Visit: Payer: Self-pay | Admitting: Family Medicine

## 2021-07-01 DIAGNOSIS — J302 Other seasonal allergic rhinitis: Secondary | ICD-10-CM

## 2021-09-01 ENCOUNTER — Other Ambulatory Visit: Payer: Self-pay | Admitting: Family Medicine

## 2021-09-01 DIAGNOSIS — H8110 Benign paroxysmal vertigo, unspecified ear: Secondary | ICD-10-CM

## 2021-10-15 ENCOUNTER — Encounter: Payer: Self-pay | Admitting: Family Medicine

## 2021-10-15 DIAGNOSIS — Z1231 Encounter for screening mammogram for malignant neoplasm of breast: Secondary | ICD-10-CM | POA: Diagnosis not present

## 2021-10-21 ENCOUNTER — Encounter: Payer: Self-pay | Admitting: Family Medicine

## 2022-02-19 ENCOUNTER — Ambulatory Visit: Payer: BC Managed Care – PPO | Admitting: Family Medicine

## 2022-02-19 ENCOUNTER — Encounter: Payer: Self-pay | Admitting: Family Medicine

## 2022-02-19 VITALS — BP 98/70 | HR 90 | Temp 97.7°F | Resp 18 | Ht 61.0 in | Wt 120.2 lb

## 2022-02-19 DIAGNOSIS — M25512 Pain in left shoulder: Secondary | ICD-10-CM

## 2022-02-19 DIAGNOSIS — H538 Other visual disturbances: Secondary | ICD-10-CM

## 2022-02-19 DIAGNOSIS — H9193 Unspecified hearing loss, bilateral: Secondary | ICD-10-CM | POA: Diagnosis not present

## 2022-02-19 DIAGNOSIS — M25511 Pain in right shoulder: Secondary | ICD-10-CM | POA: Diagnosis not present

## 2022-02-19 MED ORDER — PREDNISONE 10 MG PO TABS: ORAL_TABLET | ORAL | 0 refills | Status: DC

## 2022-02-19 MED ORDER — CYCLOBENZAPRINE HCL 10 MG PO TABS: 10.0000 mg | ORAL_TABLET | Freq: Three times a day (TID) | ORAL | 0 refills | Status: DC | PRN

## 2022-02-19 NOTE — Progress Notes (Signed)
Established Patient Office Visit  Subjective   Patient ID: Stacey [name], female    DOB: [birth year]  Age: 61 y.o. MRN: [id number]  Chief Complaint  Patient presents with   Shoulder Pain    Pain in both shoulder but mostly left. Pt states sxs started in August. Pt states carrying some heavy things.     HPI Pt is here c/o b/l shoulder pain L>R--- pt was carrying luggage at camp and it has bothered her to abduct arms since.    Pt takes aleve qpm with no relief  Pt is also asking for an audiology referral for dec hearing and opth referral for eye exam Patient Active Problem List   Diagnosis Date Noted   Preventative health care 02/26/2021   Seasonal allergies 09/21/2019   Hyperlipidemia 09/21/2019   Numbness of right foot 08/02/2017   Left knee pain 01/26/2017   Left hamstring muscle strain 03/02/2016   Hyperglycemia 09/05/2015   Family history of celiac disease 09/05/2015   Influenza A 08/04/2015   Sternoclavicular joint pain 06/06/2015   Acute bacterial sinusitis 04/23/2015   Benign paroxysmal positional vertigo 02/02/2015   Snoring 11/23/2013   Eczema 09/24/2010   OTHER VITAMIN B12 DEFICIENCY ANEMIA 01/01/2009   FATIGUE 12/26/2008   Hypothyroidism 07/03/2007   TMJ PAIN 07/03/2007   LYMPH NODE-ENLARGED 07/03/2007   DERMATITIS, CONTACT, DUE TO PLANTS 01/17/2007   Vitamin B 12 deficiency 11/11/2006   ALLERGIC RHINITIS, SEASONAL 11/11/2006   BAKER'S CYST, LEFT KNEE 11/11/2006   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   Pollen allergies    Thyroid disease 2008   HYPOTHYROIDISM   Past Surgical History:  Procedure Laterality Date   CESAREAN SECTION  1992   POPLITEAL SYNOVIAL CYST EXCISION Left 1974   Social History   Tobacco Use   Smoking status: Never   Smokeless tobacco: Never  Vaping Use   Vaping Use: Never used  Substance Use Topics   Alcohol use: No   Drug use: No   Social History   Socioeconomic History   Marital  status: Married    Spouse name: Not on file   Number of children: 2   Years of education: Not on file   Highest education level: Not on file  Occupational History   Occupation: Primary school teacher: [employer]  Tobacco Use   Smoking status: Never   Smokeless tobacco: Never  Vaping Use   Vaping Use: Never used  Substance and Sexual Activity   Alcohol use: No   Drug use: No   Sexual activity: Yes    Partners: Male    Birth control/protection: None  Other Topics Concern   Not on file  Social History Narrative   Not on file   Social Determinants of Health   Financial Resource Strain: Not on file  Food Insecurity: Not on file  Transportation Needs: Not on file  Physical Activity: Not on file  Stress: Not on file  Social Connections: Not on file  Intimate Partner Violence: Not on file   Family Status  Relation Name Status   Mother  Alive   Father  Deceased at age 58   MGF  Deceased   Daughter  Alive   Daughter  Alive   Other  (Not Specified)   Neg Hx  (Not Specified)   Family History  Problem Relation Age of Onset   Cancer Father 66  prostate,Liver cancer   Liver cancer Father    Anuerysm Maternal Grandfather    Diabetes Daughter    Celiac disease Daughter    Diabetes Daughter    Prostate cancer Other    Colon cancer Neg Hx    Allergies  Allergen Reactions   Other     TREES, POLLEN, DUST, MOLD AND ANIMALS      Review of Systems  Constitutional:  Negative for fever and malaise/fatigue.  HENT:  Negative for congestion.   Eyes:  Negative for blurred vision.  Respiratory:  Negative for shortness of breath.   Cardiovascular:  Negative for chest pain, palpitations and leg swelling.  Gastrointestinal:  Negative for abdominal pain, blood in stool and nausea.  Genitourinary:  Negative for dysuria and frequency.  Musculoskeletal:  Negative for falls.  Skin:  Negative for rash.  Neurological:  Negative for dizziness, loss of consciousness  and headaches.  Endo/Heme/Allergies:  Negative for environmental allergies.  Psychiatric/Behavioral:  Negative for depression. The patient is not nervous/anxious.       Objective:     BP 98/70 (BP Location: Right Arm, Patient Position: Sitting, Cuff Size: Normal)   Pulse 90   Temp 97.7 F (36.5 C) (Oral)   Resp 18   Ht '5\' 1"'$  (1.549 m)   Wt 120 lb 3.2 oz (54.5 kg)   LMP 07/02/2011   SpO2 98%   BMI 22.71 kg/m  BP Readings from Last 3 Encounters:  02/19/22 98/70  03/20/21 122/76  02/26/21 113/79   Wt Readings from Last 3 Encounters:  02/19/22 120 lb 3.2 oz (54.5 kg)  03/20/21 123 lb (55.8 kg)  02/26/21 120 lb (54.4 kg)   SpO2 Readings from Last 3 Encounters:  02/19/22 98%  02/26/21 98%  09/21/19 98%      Physical Exam Vitals and nursing note reviewed.  Constitutional:      Appearance: She is well-developed.  HENT:     Head: Normocephalic and atraumatic.  Eyes:     Conjunctiva/sclera: Conjunctivae normal.  Neck:     Thyroid: No thyromegaly.     Vascular: No carotid bruit or JVD.  Cardiovascular:     Rate and Rhythm: Normal rate and regular rhythm.     Heart sounds: Normal heart sounds. No murmur heard. Pulmonary:     Effort: Pulmonary effort is normal. No respiratory distress.     Breath sounds: Normal breath sounds. No wheezing or rales.  Chest:     Chest wall: No tenderness.  Musculoskeletal:        General: Tenderness present. No swelling. Normal range of motion.     Cervical back: Normal range of motion and neck supple.     Comments: Rom nml----but in pain with rom L >R  + muscle spasm R shoulder / trap  Pain with palpation --  ant and post shoulder   Neurological:     Mental Status: She is alert and oriented to person, place, and time.      No results found for any visits on 02/19/22.  Last CBC Lab Results  Component Value Date   WBC 4.5 02/26/2021   HGB 14.4 02/26/2021   HCT 42.6 02/26/2021   MCV 93.6 02/26/2021   MCH 31.8 03/14/2019    RDW 12.0 02/26/2021   PLT 235.0 67/89/3810   Last metabolic panel Lab Results  Component Value Date   GLUCOSE 83 02/26/2021   NA 139 02/26/2021   K 4.1 02/26/2021   CL 101 02/26/2021   CO2 31  02/26/2021   BUN 21 02/26/2021   CREATININE 0.75 02/26/2021   GFRNONAA >60 01/30/2015   CALCIUM 9.7 02/26/2021   PROT 6.9 02/26/2021   ALBUMIN 4.4 02/26/2021   BILITOT 0.8 02/26/2021   ALKPHOS 74 02/26/2021   AST 20 02/26/2021   ALT 13 02/26/2021   ANIONGAP 8 01/30/2015   Last lipids Lab Results  Component Value Date   CHOL 261 (H) 02/26/2021   HDL 80.40 02/26/2021   LDLCALC 170 (H) 02/26/2021   TRIG 53.0 02/26/2021   CHOLHDL 3 02/26/2021   Last hemoglobin A1c Lab Results  Component Value Date   HGBA1C 6.0 09/05/2015   Last thyroid functions Lab Results  Component Value Date   TSH 0.54 02/26/2021   T4TOTAL 8.7 09/21/2018   Last vitamin D No results found for: "25OHVITD2", "25OHVITD3", "VD25OH" Last vitamin B12 and Folate Lab Results  Component Value Date   VITAMINB12 945 (H) 10/22/2013   FOLATE 14.3 11/19/2009      The 10-year ASCVD risk score (Arnett DK, et al., 2019) is: 1.7%    Assessment & Plan:   Problem List Items Addressed This Visit   None Visit Diagnoses     Acute pain of both shoulders    -  Primary   Relevant Medications   predniSONE (DELTASONE) 10 MG tablet   Other Relevant Orders   Ambulatory referral to Sports Medicine   Bilateral hearing loss, unspecified hearing loss type       Relevant Medications   cyclobenzaprine (FLEXERIL) 10 MG tablet   Other Relevant Orders   Ambulatory referral to Audiology   Blurry vision       Relevant Orders   Ambulatory referral to Ophthalmology       Return if symptoms worsen or fail to improve.    Ann Held, DO

## 2022-02-19 NOTE — Patient Instructions (Signed)

## 2022-02-24 ENCOUNTER — Ambulatory Visit: Payer: Self-pay

## 2022-02-24 ENCOUNTER — Ambulatory Visit: Payer: BC Managed Care – PPO | Admitting: Family Medicine

## 2022-02-24 VITALS — BP 118/68 | Ht 61.0 in | Wt 120.0 lb

## 2022-02-24 DIAGNOSIS — M25511 Pain in right shoulder: Secondary | ICD-10-CM | POA: Diagnosis not present

## 2022-02-24 DIAGNOSIS — M25512 Pain in left shoulder: Secondary | ICD-10-CM | POA: Diagnosis not present

## 2022-02-24 NOTE — Patient Instructions (Signed)
You have rotator cuff impingement.  On the right you have an old partial width rotator cuff tear as well. Try to avoid painful activities (overhead activities, lifting with extended arm) as much as possible. Aleve 2 tabs twice a day with food OR ibuprofen 3 tabs three times a day with food for pain and inflammation as needed. Can take tylenol in addition to this. Subacromial injection may be beneficial to help with pain and to decrease inflammation if you're struggling but would recommend trying to avoid this if possible. Consider physical therapy with transition to home exercise program. Do home exercise program with theraband and scapular stabilization exercises daily 3 sets of 10 once a day. If not improving at follow-up we will consider injection, physical therapy, and/or nitro patches. Follow up with me in 1 month.

## 2022-02-25 NOTE — Progress Notes (Signed)
PCP: Ann Held, DO  Subjective:   HPI: Patient is a 61 y.o. female here for bilateral shoulder pain.  Patient reports about 1-2 months ago she was lifting luggage with arms extended to sides. No acute injury or trauma. About a week or so after this is when she really noticed pain lateral bilateral shoulders. Ok with all motions except lifting arms out to sides. No acute injury or trauma. No numbness, neck pain.  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     cetirizine (ZYRTEC) 10 MG tablet Take 10 mg by mouth daily.     Cholecalciferol (VITAMIN D) 1000 UNITS capsule Take 1,000 Units by mouth daily.       Cyanocobalamin (B-12) 2500 MCG TABS Take 1 tablet by mouth daily.     cyclobenzaprine (FLEXERIL) 10 MG tablet Take 1 tablet (10 mg total) by mouth 3 (three) times daily as needed for muscle spasms. 30 tablet 0   fluticasone (FLONASE) 50 MCG/ACT nasal spray Use 2 spray(s) in each nostril once daily 16 g 5   ibuprofen (ADVIL) 600 MG tablet Take 600 mg by mouth every 6 (six) hours as needed.     levothyroxine (SYNTHROID) 75 MCG tablet TAKE 1 TABLET BY MOUTH ONCE DAILY BEFORE BREAKFAST 90 tablet 3   meclizine (ANTIVERT) 25 MG tablet TAKE 1 TABLET BY MOUTH THREE TIMES DAILY AS NEEDED FOR DIZZINESS 90 tablet 0   Misc Natural Products (AIRBORNE ELDERBERRY) CHEW Chew 1 tablet by mouth daily.     Multiple Vitamin (MULTIVITAMIN) tablet Take 1 tablet by mouth daily.       Omega-3 Fatty Acids (FISH OIL) 1000 MG CAPS Take by mouth.     predniSONE (DELTASONE) 10 MG tablet TAKE 3 TABLETS PO QD FOR 3 DAYS THEN TAKE 2 TABLETS PO QD FOR 3 DAYS THEN TAKE 1 TABLET PO QD FOR 3 DAYS THEN TAKE 1/2 TAB PO QD FOR 3 DAYS 20 tablet 0   No current  facility-administered medications on file prior to visit.    Past Surgical History:  Procedure Laterality Date   CESAREAN SECTION  1992   POPLITEAL SYNOVIAL CYST EXCISION Left 1974    Allergies  Allergen Reactions   Other     TREES, POLLEN, DUST, MOLD AND ANIMALS    BP 118/68   Ht '5\' 1"'$  (1.549 m)   Wt 120 lb (54.4 kg)   LMP 07/02/2011   BMI 22.67 kg/m       No data to display              No data to display              Objective:  Physical Exam:  Gen: NAD, comfortable in exam room  Right shoulder: No swelling, ecchymoses.  No gross deformity. No TTP. FROM with painful arc. Negative Hawkins, Neers. Negative Yergasons. Strength 5/5 with empty can and resisted internal/external rotation.  Pain empty can. Negative apprehension. NV intact distally.  Left shoulder: No swelling, ecchymoses.  No gross deformity. No TTP. FROM with painful arc. Positive Hawkins, Neers. Negative Yergasons. Strength 5-/5 with empty can and 5/5 resisted internal/external rotation.  Pain empty can. Negative apprehension. NV intact distally.  Complete MSK u/s left shoulder:  Biceps tendon: not visualized in bicipital groove in short or long axis consistent with complete tear.  Anechoic fluid noted. Pec major tendon: intact Subscapularis: cortical irregularity at insertion but without acute tear - some minimal calcific change on insertional side of tendon. AC joint: mild arthropathy without effusion Infraspinatus: intact Supraspinatus: full thickness partial width tear anterior aspect of tendon.  Mid-posterior tendon intact without abnormalities Posterior glenohumeral joint: no effusion.  Impression: Left supraspinatus partial width full thickness tear, remote.  Proximal biceps tendon tear.  Remote partial thickness tearing of subscapularis that has healed.  Mild AC arthropathy.  Complete MSK u/s right shoulder:  Biceps tendon: intact with mild tenosynovitis Pec major  tendon: intact Subscapularis: intact without abnormalities AC joint: mild arthropathy. Infraspinatus: intact Supraspinatus: intact without visible tears.  Mild overlying bursitis Posterior glenohumeral joint: no effusion  Impression: Mild subacromial bursitis.  Mild biceps tenosynovitis.  Mild AC arthropathy  Assessment & Plan:  1. Bilateral shoulder pain - primarily due to rotator cuff impingement acutely though evidence of remote partial width full thickness tear of left supraspinatus and proximal biceps tendon rupture this side (this is not symptomatic).  Aleve or ibuprofen.  Home exercise program reviewed.  Consider subacromial injection(s), physical therapy, nitro patches if not improving.  F/u in 1 month.

## 2022-03-03 DIAGNOSIS — H93293 Other abnormal auditory perceptions, bilateral: Secondary | ICD-10-CM | POA: Diagnosis not present

## 2022-03-12 ENCOUNTER — Encounter: Payer: Self-pay | Admitting: Family Medicine

## 2022-03-18 ENCOUNTER — Ambulatory Visit (INDEPENDENT_AMBULATORY_CARE_PROVIDER_SITE_OTHER): Payer: BC Managed Care – PPO | Admitting: Family Medicine

## 2022-03-18 ENCOUNTER — Encounter: Payer: Self-pay | Admitting: Family Medicine

## 2022-03-18 VITALS — BP 110/70 | HR 72 | Temp 98.2°F | Resp 18 | Ht 61.0 in | Wt 123.0 lb

## 2022-03-18 DIAGNOSIS — E038 Other specified hypothyroidism: Secondary | ICD-10-CM | POA: Diagnosis not present

## 2022-03-18 DIAGNOSIS — E538 Deficiency of other specified B group vitamins: Secondary | ICD-10-CM | POA: Diagnosis not present

## 2022-03-18 DIAGNOSIS — Z0001 Encounter for general adult medical examination with abnormal findings: Secondary | ICD-10-CM | POA: Diagnosis not present

## 2022-03-18 DIAGNOSIS — H9193 Unspecified hearing loss, bilateral: Secondary | ICD-10-CM

## 2022-03-18 DIAGNOSIS — Z0000 Encounter for general adult medical examination without abnormal findings: Secondary | ICD-10-CM

## 2022-03-18 DIAGNOSIS — R42 Dizziness and giddiness: Secondary | ICD-10-CM

## 2022-03-18 DIAGNOSIS — Z23 Encounter for immunization: Secondary | ICD-10-CM

## 2022-03-18 DIAGNOSIS — E782 Mixed hyperlipidemia: Secondary | ICD-10-CM

## 2022-03-18 NOTE — Assessment & Plan Note (Signed)
Encourage heart healthy diet such as MIND or DASH diet, increase exercise, avoid trans fats, simple carbohydrates and processed foods, consider a krill or fish or flaxseed oil cap daily.  °

## 2022-03-18 NOTE — Progress Notes (Signed)
Subjective:   By signing my name below, I, Stacey [name], attest that this documentation has been prepared under the direction and in the presence of Stacey Held, DO. 03/18/2022     Patient ID: Stacey [name], female    DOB: 1961-03-14, 61 y.o.   MRN: [id number]  Chief Complaint  Patient presents with   Annual Exam    HPI Patient is in today for a comprehensive physical exam.   She denies having any fever, new muscle pain, new joint pain, new moles, congestion, sinus pain, sore throat, chest pain, palpations, cough, SOB, wheezing, n/v/d, constipation, blood in stool, dysuria, frequency, hematuria, or headaches at this time. Her mother is diagnosed with A-fib, otherwise she has no changes to her family medical history.  She is not interested in receiving the flu vaccine. She is due for a tetanus vaccine. She is interested in receiving the shingles vaccine at a later time.  She is following up with her eye doctor regularly.  She participates in regular exercise by running.    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   Pollen allergies    Thyroid disease 2008   HYPOTHYROIDISM    Past Surgical History:  Procedure Laterality Date   CESAREAN SECTION  1992   POPLITEAL SYNOVIAL CYST EXCISION Left 1974    Family History  Problem Relation Age of Onset   Atrial fibrillation Mother    Cancer Father 41       prostate,Liver cancer   Liver cancer Father    Anuerysm Maternal Grandfather    Diabetes Daughter    Celiac disease Daughter    Diabetes Daughter    Prostate cancer Other    Colon cancer Neg Hx     Social History   Socioeconomic History   Marital status: Married    Spouse name: Not on file   Number of children: 2   Years of education: Not on file   Highest education level: Not on file  Occupational History   Occupation: Training and development officer  Tobacco Use   Smoking status: Never   Smokeless tobacco: Never  Vaping Use    Vaping Use: Never used  Substance and Sexual Activity   Alcohol use: No   Drug use: No   Sexual activity: Yes    Partners: Male    Birth control/protection: None  Other Topics Concern   Not on file  Social History Narrative   Exercise -- running    Social Determinants of Health   Financial Resource Strain: Not on file  Food Insecurity: Not on file  Transportation Needs: Not on file  Physical Activity: Not on file  Stress: Not on file  Social Connections: Not on file  Intimate Partner Violence: Not on file    Outpatient Medications Prior to Visit  Medication Sig Dispense Refill   ascorbic acid (VITAMIN C) 250 MG CHEW Chew 250 mg by mouth daily.     cetirizine (ZYRTEC) 10 MG tablet Take 10 mg by mouth daily.     Cholecalciferol (VITAMIN D) 1000 UNITS capsule Take 1,000 Units by mouth daily.       Cyanocobalamin (B-12) 2500 MCG TABS Take 1 tablet by mouth daily.     cyclobenzaprine (FLEXERIL) 10 MG tablet Take 1 tablet (10 mg total) by mouth 3 (three) times daily as needed for muscle spasms. 30 tablet 0   fluticasone (FLONASE) 50 MCG/ACT nasal spray Use 2 spray(s) in each nostril once  daily 16 g 5   ibuprofen (ADVIL) 600 MG tablet Take 600 mg by mouth every 6 (six) hours as needed.     levothyroxine (SYNTHROID) 75 MCG tablet TAKE 1 TABLET BY MOUTH ONCE DAILY BEFORE BREAKFAST 90 tablet 3   meclizine (ANTIVERT) 25 MG tablet TAKE 1 TABLET BY MOUTH THREE TIMES DAILY AS NEEDED FOR DIZZINESS 90 tablet 0   Misc Natural Products (AIRBORNE ELDERBERRY) CHEW Chew 1 tablet by mouth daily.     Multiple Vitamin (MULTIVITAMIN) tablet Take 1 tablet by mouth daily.       Omega-3 Fatty Acids (FISH OIL) 1000 MG CAPS Take by mouth.     predniSONE (DELTASONE) 10 MG tablet TAKE 3 TABLETS PO QD FOR 3 DAYS THEN TAKE 2 TABLETS PO QD FOR 3 DAYS THEN TAKE 1 TABLET PO QD FOR 3 DAYS THEN TAKE 1/2 TAB PO QD FOR 3 DAYS 20 tablet 0   No facility-administered medications prior to visit.    Allergies  Allergen  Reactions   Other     TREES, POLLEN, DUST, MOLD AND ANIMALS    Review of Systems  Constitutional:  Negative for fever and malaise/fatigue.  HENT:  Negative for congestion, sinus pain and sore throat.   Eyes:  Negative for blurred vision.  Respiratory:  Negative for cough, shortness of breath and wheezing.   Cardiovascular:  Negative for chest pain, palpitations and leg swelling.  Gastrointestinal:  Negative for abdominal pain, blood in stool, constipation, diarrhea, nausea and vomiting.  Genitourinary:  Negative for dysuria, frequency and hematuria.  Musculoskeletal:  Negative for falls.       (-)new muscle pain (-)new joint pain  Skin:  Negative for rash.       (-)New moles  Neurological:  Negative for dizziness, loss of consciousness and headaches.  Endo/Heme/Allergies:  Negative for environmental allergies.  Psychiatric/Behavioral:  Negative for depression. The patient is not nervous/anxious.        Objective:    Physical Exam Vitals and nursing note reviewed.  Constitutional:      General: She is not in acute distress.    Appearance: Normal appearance. She is not ill-appearing.  HENT:     Head: Normocephalic and atraumatic.     Right Ear: Tympanic membrane, ear canal and external ear normal.     Left Ear: Tympanic membrane, ear canal and external ear normal.  Eyes:     Extraocular Movements: Extraocular movements intact.     Pupils: Pupils are equal, round, and reactive to light.  Cardiovascular:     Rate and Rhythm: Normal rate and regular rhythm.     Heart sounds: Normal heart sounds. No murmur heard.    No gallop.  Pulmonary:     Effort: Pulmonary effort is normal. No respiratory distress.     Breath sounds: Normal breath sounds. No wheezing or rales.  Abdominal:     General: Bowel sounds are normal. There is no distension.     Palpations: Abdomen is soft.     Tenderness: There is no abdominal tenderness. There is no guarding.  Musculoskeletal:     Cervical  back: Normal range of motion.  Skin:    General: Skin is warm and dry.  Neurological:     Mental Status: She is alert and oriented to person, place, and time.  Psychiatric:        Judgment: Judgment normal.     BP 110/70   Pulse 72   Temp 98.2 F (36.8 C)   Resp  18   Ht '5\' 1"'$  (1.549 m)   Wt 123 lb (55.8 kg)   LMP 07/02/2011   SpO2 95%   BMI 23.24 kg/m  Wt Readings from Last 3 Encounters:  03/18/22 123 lb (55.8 kg)  02/24/22 120 lb (54.4 kg)  02/19/22 120 lb 3.2 oz (54.5 kg)    Diabetic Foot Exam - Simple   No data filed    Lab Results  Component Value Date   WBC 4.5 02/26/2021   HGB 14.4 02/26/2021   HCT 42.6 02/26/2021   PLT 235.0 02/26/2021   GLUCOSE 83 02/26/2021   CHOL 261 (H) 02/26/2021   TRIG 53.0 02/26/2021   HDL 80.40 02/26/2021   LDLCALC 170 (H) 02/26/2021   ALT 13 02/26/2021   AST 20 02/26/2021   NA 139 02/26/2021   K 4.1 02/26/2021   CL 101 02/26/2021   CREATININE 0.75 02/26/2021   BUN 21 02/26/2021   CO2 31 02/26/2021   TSH 0.54 02/26/2021   HGBA1C 6.0 09/05/2015   MICROALBUR 0.7 12/13/2014    Lab Results  Component Value Date   TSH 0.54 02/26/2021   Lab Results  Component Value Date   WBC 4.5 02/26/2021   HGB 14.4 02/26/2021   HCT 42.6 02/26/2021   MCV 93.6 02/26/2021   PLT 235.0 02/26/2021   Lab Results  Component Value Date   NA 139 02/26/2021   K 4.1 02/26/2021   CO2 31 02/26/2021   GLUCOSE 83 02/26/2021   BUN 21 02/26/2021   CREATININE 0.75 02/26/2021   BILITOT 0.8 02/26/2021   ALKPHOS 74 02/26/2021   AST 20 02/26/2021   ALT 13 02/26/2021   PROT 6.9 02/26/2021   ALBUMIN 4.4 02/26/2021   CALCIUM 9.7 02/26/2021   ANIONGAP 8 01/30/2015   GFR 87.72 02/26/2021   Lab Results  Component Value Date   CHOL 261 (H) 02/26/2021   Lab Results  Component Value Date   HDL 80.40 02/26/2021   Lab Results  Component Value Date   LDLCALC 170 (H) 02/26/2021   Lab Results  Component Value Date   TRIG 53.0 02/26/2021    Lab Results  Component Value Date   CHOLHDL 3 02/26/2021   Lab Results  Component Value Date   HGBA1C 6.0 09/05/2015   Mammogram: Last completed 10/21/2021. Results are normal. Repeat in 1 year.  Colonoscopy: Last completed 01/02/2014. Results are normal. Repeat in 1 year.  Pap smear: Last completed 03/20/2021. Results are normal. Repeat in 1 year.      Assessment & Plan:   Problem List Items Addressed This Visit       Unprioritized   Vitamin B 12 deficiency   Relevant Orders   CBC with Differential/Platelet   Vitamin B12   Preventative health care - Primary    ghm utd Check labs  See avs       Relevant Orders   CBC with Differential/Platelet   Comprehensive metabolic panel   TSH   Lipid panel   Vitamin B12   Hypothyroidism    Check labs       Relevant Orders   TSH   Hyperlipidemia    Encourage heart healthy diet such as MIND or DASH diet, increase exercise, avoid trans fats, simple carbohydrates and processed foods, consider a krill or fish or flaxseed oil cap daily.       Other Visit Diagnoses     Bilateral hearing loss, unspecified hearing loss type       Vertigo  Relevant Orders   Ambulatory referral to ENT   Need for Tdap vaccination       Relevant Orders   Tdap vaccine greater than or equal to 7yo IM (Completed)        No orders of the defined types were placed in this encounter.   IAnn Held, DO, personally preformed the services described in this documentation.  All medical record entries made by the scribe were at my direction and in my presence.  I have reviewed the chart and discharge instructions (if applicable) and agree that the record reflects my personal performance and is accurate and complete. 03/18/2022   I,Stacey [name],acting as a scribe for Stacey Held, DO.,have documented all relevant documentation on the behalf of Stacey Held, DO,as directed by  Stacey Held, DO while in the  presence of Stacey Held, DO.   Stacey Held, DO

## 2022-03-18 NOTE — Patient Instructions (Signed)
Preventive Care 40-59 Years Old, Female Preventive care refers to lifestyle choices and visits with your health care provider that can promote health and wellness. Preventive care visits are also called wellness exams. What can I expect for my preventive care visit? Counseling Your health care provider may ask you questions about your: Medical history, including: Past medical problems. Family medical history. Pregnancy history. Current health, including: Menstrual cycle. Method of birth control. Emotional well-being. Home life and relationship well-being. Sexual activity and sexual health. Lifestyle, including: Alcohol, nicotine or tobacco, and drug use. Access to firearms. Diet, exercise, and sleep habits. Work and work environment. Sunscreen use. Safety issues such as seatbelt and bike helmet use. Physical exam Your health care provider will check your: Height and weight. These may be used to calculate your BMI (body mass index). BMI is a measurement that tells if you are at a healthy weight. Waist circumference. This measures the distance around your waistline. This measurement also tells if you are at a healthy weight and may help predict your risk of certain diseases, such as type 2 diabetes and high blood pressure. Heart rate and blood pressure. Body temperature. Skin for abnormal spots. What immunizations do I need?  Vaccines are usually given at various ages, according to a schedule. Your health care provider will recommend vaccines for you based on your age, medical history, and lifestyle or other factors, such as travel or where you work. What tests do I need? Screening Your health care provider may recommend screening tests for certain conditions. This may include: Lipid and cholesterol levels. Diabetes screening. This is done by checking your blood sugar (glucose) after you have not eaten for a while (fasting). Pelvic exam and Pap test. Hepatitis B test. Hepatitis C  test. HIV (human immunodeficiency virus) test. STI (sexually transmitted infection) testing, if you are at risk. Lung cancer screening. Colorectal cancer screening. Mammogram. Talk with your health care provider about when you should start having regular mammograms. This may depend on whether you have a family history of breast cancer. BRCA-related cancer screening. This may be done if you have a family history of breast, ovarian, tubal, or peritoneal cancers. Bone density scan. This is done to screen for osteoporosis. Talk with your health care provider about your test results, treatment options, and if necessary, the need for more tests. Follow these instructions at home: Eating and drinking  Eat a diet that includes fresh fruits and vegetables, whole grains, lean protein, and low-fat dairy products. Take vitamin and mineral supplements as recommended by your health care provider. Do not drink alcohol if: Your health care provider tells you not to drink. You are pregnant, may be pregnant, or are planning to become pregnant. If you drink alcohol: Limit how much you have to 0-1 drink a day. Know how much alcohol is in your drink. In the [place] one drink equals one 12 oz bottle of beer (355 mL), one 5 oz glass of wine (148 mL), or one 1 oz glass of hard liquor (44 mL). Lifestyle Brush your teeth every morning and night with fluoride toothpaste. Floss one time each day. Exercise for at least 30 minutes 5 or more days each week. Do not use any products that contain nicotine or tobacco. These products include cigarettes, chewing tobacco, and vaping devices, such as e-cigarettes. If you need help quitting, ask your health care provider. Do not use drugs. If you are sexually active, practice safe sex. Use a condom or other form of protection to   prevent STIs. If you do not wish to become pregnant, use a form of birth control. If you plan to become pregnant, see your health care provider for a  prepregnancy visit. Take aspirin only as told by your health care provider. Make sure that you understand how much to take and what form to take. Work with your health care provider to find out whether it is safe and beneficial for you to take aspirin daily. Find healthy ways to manage stress, such as: Meditation, yoga, or listening to music. Journaling. Talking to a trusted person. Spending time with friends and family. Minimize exposure to UV radiation to reduce your risk of skin cancer. Safety Always wear your seat belt while driving or riding in a vehicle. Do not drive: If you have been drinking alcohol. Do not ride with someone who has been drinking. When you are tired or distracted. While texting. If you have been using any mind-altering substances or drugs. Wear a helmet and other protective equipment during sports activities. If you have firearms in your house, make sure you follow all gun safety procedures. Seek help if you have been physically or sexually abused. What's next? Visit your health care provider once a year for an annual wellness visit. Ask your health care provider how often you should have your eyes and teeth checked. Stay up to date on all vaccines. This information is not intended to replace advice given to you by your health care provider. Make sure you discuss any questions you have with your health care provider. Document Revised: 11/19/2020 Document Reviewed: 11/19/2020 Elsevier Patient Education  [place].

## 2022-03-18 NOTE — Assessment & Plan Note (Signed)
Check labs 

## 2022-03-18 NOTE — Assessment & Plan Note (Signed)
ghm utd Check labs  See avs  

## 2022-03-19 LAB — CBC WITH DIFFERENTIAL/PLATELET
Basophils Absolute: 0 10*3/uL (ref 0.0–0.1)
Basophils Relative: 0.9 % (ref 0.0–3.0)
Eosinophils Absolute: 0.1 10*3/uL (ref 0.0–0.7)
Eosinophils Relative: 2.3 % (ref 0.0–5.0)
HCT: 41.4 % (ref 36.0–46.0)
Hemoglobin: 14.2 g/dL (ref 12.0–15.0)
Lymphocytes Relative: 35.9 % (ref 12.0–46.0)
Lymphs Abs: 1.5 10*3/uL (ref 0.7–4.0)
MCHC: 34.4 g/dL (ref 30.0–36.0)
MCV: 92.7 fl (ref 78.0–100.0)
Monocytes Absolute: 0.4 10*3/uL (ref 0.1–1.0)
Monocytes Relative: 9.4 % (ref 3.0–12.0)
Neutro Abs: 2.1 10*3/uL (ref 1.4–7.7)
Neutrophils Relative %: 51.5 % (ref 43.0–77.0)
Platelets: 235 10*3/uL (ref 150.0–400.0)
RBC: 4.46 Mil/uL (ref 3.87–5.11)
RDW: 12.1 % (ref 11.5–15.5)
WBC: 4.1 10*3/uL (ref 4.0–10.5)

## 2022-03-19 LAB — LIPID PANEL
Cholesterol: 240 mg/dL — ABNORMAL HIGH (ref 0–200)
HDL: 74.4 mg/dL (ref >39.00)
LDL Cholesterol: 149 mg/dL — ABNORMAL HIGH (ref 0–99)
NonHDL: 165.68
Total CHOL/HDL Ratio: 3
Triglycerides: 81 mg/dL (ref 0.0–149.0)
VLDL: 16.2 mg/dL (ref 0.0–40.0)

## 2022-03-19 LAB — COMPREHENSIVE METABOLIC PANEL
ALT: 13 U/L (ref 0–35)
AST: 18 U/L (ref 0–37)
Albumin: 4.2 g/dL (ref 3.5–5.2)
Alkaline Phosphatase: 73 U/L (ref 39–117)
BUN: 20 mg/dL (ref 6–23)
CO2: 31 mEq/L (ref 19–32)
Calcium: 9.5 mg/dL (ref 8.4–10.5)
Chloride: 101 mEq/L (ref 96–112)
Creatinine, Ser: 0.67 mg/dL (ref 0.40–1.20)
GFR: 95.59 mL/min (ref >60.00)
Glucose, Bld: 90 mg/dL (ref 70–99)
Potassium: 4.2 mEq/L (ref 3.5–5.1)
Sodium: 138 mEq/L (ref 135–145)
Total Bilirubin: 0.7 mg/dL (ref 0.2–1.2)
Total Protein: 6.7 g/dL (ref 6.0–8.3)

## 2022-03-19 LAB — TSH: TSH: 1.22 u[IU]/mL (ref 0.35–5.50)

## 2022-03-19 LAB — VITAMIN B12: Vitamin B-12: >1500 pg/mL — ABNORMAL HIGH (ref 211–911)

## 2022-03-24 ENCOUNTER — Ambulatory Visit (INDEPENDENT_AMBULATORY_CARE_PROVIDER_SITE_OTHER): Payer: BC Managed Care – PPO | Admitting: Nurse Practitioner

## 2022-03-24 ENCOUNTER — Encounter: Payer: Self-pay | Admitting: Nurse Practitioner

## 2022-03-24 VITALS — BP 102/62 | HR 84 | Ht 61.0 in | Wt 121.0 lb

## 2022-03-24 DIAGNOSIS — Z01419 Encounter for gynecological examination (general) (routine) without abnormal findings: Secondary | ICD-10-CM

## 2022-03-24 DIAGNOSIS — Z780 Asymptomatic menopausal state: Secondary | ICD-10-CM

## 2022-03-24 NOTE — Progress Notes (Signed)
   Stacey [name] 1961-01-23 [id number]   History:  61 y.o. G2P2 presents for annual exam without GYN complaints. Postmenopausal - no HRT, no bleeding. Normal pap and mammogram history. Hypothyroidism managed by PCP.   Gynecologic History Patient's last menstrual period was 07/02/2011.   Contraception: post menopausal status Sexually active: Yes  Health maintenance Last Pap: 03/20/2021. Results were: Normal neg HPV Last mammogram: 10/15/2021. Results were: Normal Last colonoscopy: 01/02/2014. Results were: Normal, 10 year recall Last Dexa: Not indicated  Past medical history, past surgical history, family history and social history were all reviewed and documented in the EPIC chart. 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 2 daughters, youngest is married, just had son at 46 weeks. Oldest is working on Scientist, water quality.  ROS:  A ROS was performed and pertinent positives and negatives are included.  Exam:  Vitals:   03/24/22 1056  BP: 102/62  Pulse: 84  SpO2: 98%  Weight: 121 lb (54.9 kg)  Height: '5\' 1"'$  (1.549 m)    Body mass index is 22.86 kg/m.  General appearance:  Normal Thyroid:  Symmetrical, normal in size, without palpable masses or nodularity. Respiratory  Auscultation:  Clear without wheezing or rhonchi Cardiovascular  Auscultation:  Regular rate, without rubs, murmurs or gallops  Edema/varicosities:  Not grossly evident Abdominal  Soft,nontender, without masses, guarding or rebound.  Liver/spleen:  No organomegaly noted  Hernia:  None appreciated  Skin  Inspection:  Grossly normal   Breasts: Examined lying and sitting.   Right: Without masses, retractions, discharge or axillary adenopathy.   Left: Without masses, retractions, discharge or axillary adenopathy. Genitourinary   Inguinal/mons:  Normal without inguinal adenopathy  External genitalia:  Normal appearing vulva with no masses, tenderness, or lesions  BUS/Urethra/Skene's glands:   Normal  Vagina:  Normal appearing with normal color and discharge, no lesions  Cervix:  Normal appearing without discharge or lesion. Stenotic  Uterus:  Normal in size, shape and contour.  Midline and mobile, nontender  Adnexa/parametria:     Rt: Normal in size, without masses or tenderness.   Lt: Normal in size, without masses or tenderness.  Anus and perineum: Normal  Digital rectal exam: Normal sphincter tone without palpated masses or tenderness  Patient informed chaperone available to be present for breast and pelvic exam. Patient has requested no chaperone to be present. Patient has been advised what will be completed during breast and pelvic exam.   Assessment/Plan:  61 y.o. G2P2 for annual exam.   Well female exam with routine gynecological exam - Education provided on SBEs, importance of preventative screenings, current guidelines, high calcium diet, regular exercise, and multivitamin daily. Labs with PCP.  Postmenopausal - no HRT, no bleeding  Screening for cervical cancer - Normal Pap history.   Will repeat at 5-year interval per guidelines.   Screening for breast cancer - Normal mammogram history.  Continue annual screenings. Normal breast exam today.  Screening for colon cancer - 2015 colonoscopy. Will repeat at interval per GI's recommended interval.  Screening for osteoporosis - Average risk. Will plan DXA at age 57.   Follow-up in 1 year for annual.       Tamela Gammon [facility], 11:47 AM 03/24/2022

## 2022-03-31 ENCOUNTER — Other Ambulatory Visit: Payer: Self-pay | Admitting: Family Medicine

## 2022-03-31 DIAGNOSIS — J302 Other seasonal allergic rhinitis: Secondary | ICD-10-CM

## 2022-05-16 ENCOUNTER — Other Ambulatory Visit: Payer: Self-pay | Admitting: Family Medicine

## 2022-05-27 DIAGNOSIS — H2513 Age-related nuclear cataract, bilateral: Secondary | ICD-10-CM | POA: Diagnosis not present

## 2022-07-07 DIAGNOSIS — R42 Dizziness and giddiness: Secondary | ICD-10-CM | POA: Diagnosis not present

## 2022-07-10 ENCOUNTER — Other Ambulatory Visit: Payer: Self-pay | Admitting: Family Medicine

## 2022-07-10 DIAGNOSIS — H8110 Benign paroxysmal vertigo, unspecified ear: Secondary | ICD-10-CM

## 2022-11-02 DIAGNOSIS — Z1231 Encounter for screening mammogram for malignant neoplasm of breast: Secondary | ICD-10-CM | POA: Diagnosis not present

## 2022-11-02 LAB — HM MAMMOGRAPHY

## 2023-03-22 ENCOUNTER — Ambulatory Visit: Payer: BC Managed Care – PPO | Admitting: Nurse Practitioner

## 2023-03-30 ENCOUNTER — Ambulatory Visit: Payer: BC Managed Care – PPO | Admitting: Nurse Practitioner

## 2023-04-07 ENCOUNTER — Ambulatory Visit: Payer: BC Managed Care – PPO | Admitting: Family Medicine

## 2023-04-07 ENCOUNTER — Ambulatory Visit: Payer: BC Managed Care – PPO | Admitting: Nurse Practitioner

## 2023-04-07 ENCOUNTER — Encounter: Payer: Self-pay | Admitting: Family Medicine

## 2023-04-07 VITALS — BP 108/60 | HR 71 | Temp 97.9°F | Resp 18 | Ht 61.0 in | Wt 123.8 lb

## 2023-04-07 DIAGNOSIS — E538 Deficiency of other specified B group vitamins: Secondary | ICD-10-CM | POA: Diagnosis not present

## 2023-04-07 DIAGNOSIS — R5383 Other fatigue: Secondary | ICD-10-CM

## 2023-04-07 DIAGNOSIS — H1013 Acute atopic conjunctivitis, bilateral: Secondary | ICD-10-CM | POA: Diagnosis not present

## 2023-04-07 LAB — COMPREHENSIVE METABOLIC PANEL
ALT: 15 U/L (ref 0–35)
AST: 21 U/L (ref 0–37)
Albumin: 4.3 g/dL (ref 3.5–5.2)
Alkaline Phosphatase: 69 U/L (ref 39–117)
BUN: 18 mg/dL (ref 6–23)
CO2: 32 meq/L (ref 19–32)
Calcium: 9.6 mg/dL (ref 8.4–10.5)
Chloride: 99 meq/L (ref 96–112)
Creatinine, Ser: 0.74 mg/dL (ref 0.40–1.20)
GFR: 87.84 mL/min (ref >60.00)
Glucose, Bld: 86 mg/dL (ref 70–99)
Potassium: 4 meq/L (ref 3.5–5.1)
Sodium: 139 meq/L (ref 135–145)
Total Bilirubin: 0.6 mg/dL (ref 0.2–1.2)
Total Protein: 6.9 g/dL (ref 6.0–8.3)

## 2023-04-07 LAB — CBC WITH DIFFERENTIAL/PLATELET
Basophils Absolute: 0.1 10*3/uL (ref 0.0–0.1)
Basophils Relative: 2 % (ref 0.0–3.0)
Eosinophils Absolute: 0.1 10*3/uL (ref 0.0–0.7)
Eosinophils Relative: 2.1 % (ref 0.0–5.0)
HCT: 44.5 % (ref 36.0–46.0)
Hemoglobin: 14.5 g/dL (ref 12.0–15.0)
Lymphocytes Relative: 38.8 % (ref 12.0–46.0)
Lymphs Abs: 1.7 10*3/uL (ref 0.7–4.0)
MCHC: 32.7 g/dL (ref 30.0–36.0)
MCV: 94.7 fL (ref 78.0–100.0)
Monocytes Absolute: 0.4 10*3/uL (ref 0.1–1.0)
Monocytes Relative: 9.5 % (ref 3.0–12.0)
Neutro Abs: 2 10*3/uL (ref 1.4–7.7)
Neutrophils Relative %: 47.6 % (ref 43.0–77.0)
Platelets: 250 10*3/uL (ref 150.0–400.0)
RBC: 4.69 Mil/uL (ref 3.87–5.11)
RDW: 12.2 % (ref 11.5–15.5)
WBC: 4.3 10*3/uL (ref 4.0–10.5)

## 2023-04-07 LAB — POC INFLUENZA A&B (BINAX/QUICKVUE)
Influenza A, POC: NEGATIVE
Influenza B, POC: NEGATIVE

## 2023-04-07 LAB — VITAMIN D 25 HYDROXY (VIT D DEFICIENCY, FRACTURES): VITD: 58.23 ng/mL (ref 30.00–100.00)

## 2023-04-07 LAB — POC COVID19 BINAXNOW: SARS Coronavirus 2 Ag: NEGATIVE

## 2023-04-07 LAB — TSH: TSH: 1.75 u[IU]/mL (ref 0.35–5.50)

## 2023-04-07 LAB — VITAMIN B12: Vitamin B-12: 1376 pg/mL — ABNORMAL HIGH (ref 211–911)

## 2023-04-07 MED ORDER — AZELASTINE HCL 0.05 % OP SOLN
1.0000 [drp] | Freq: Two times a day (BID) | OPHTHALMIC | 12 refills | Status: AC
Start: 2023-04-07 — End: ?

## 2023-04-07 NOTE — Assessment & Plan Note (Signed)
Optivar sent in Pt will let us know if her eyes do not improve

## 2023-04-07 NOTE — Assessment & Plan Note (Signed)
Con't allergy meds

## 2023-04-07 NOTE — Progress Notes (Signed)
Established Patient Office Visit  Subjective   Patient ID: Stacey [name], female    DOB: 16-Sep-1960  Age: 62 y.o. MRN: [id number]  Chief Complaint  Patient presents with   Sinus Problem    Sxs started Monday, Pt states she feels like she fight something off. No cough or fever. No pain with swallowing.     HPI Discussed the use of AI scribe software for clinical note transcription with the patient, who gave verbal consent to proceed.  History of Present Illness   The patient, with a history of allergies, presents with fatigue and eye irritation. She reports feeling 'off' and 'drained' since Monday, with some dizzy spells. She denies body aches, fevers, and coughing, but notes increased allergy symptoms and nasal congestion. She has been using over-the-counter Systane eye drops for the past month or two for itchy, runny eyes, which [place]. She also takes a 24-hour allergy medication daily and uses fluticasone nasal spray for congestion.  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 She has not taken a COVID test, but notes that her symptoms could potentially be a sign of COVID. She also mentions that she is on B12, which she takes sublingually.      Patient Active Problem List   Diagnosis Date Noted   Allergic conjunctivitis of both eyes 04/07/2023   Other fatigue 04/07/2023   Preventative health care 02/26/2021   Seasonal allergies 09/21/2019   Hyperlipidemia 09/21/2019   Numbness of right foot 08/02/2017   Left knee pain 01/26/2017   Left hamstring muscle strain 03/02/2016   Hyperglycemia 09/05/2015   Family history of celiac disease 09/05/2015   Influenza A 08/04/2015   Sternoclavicular joint pain 06/06/2015   Acute bacterial sinusitis 04/23/2015   Benign paroxysmal positional vertigo 02/02/2015   Snoring 11/23/2013   Eczema 09/24/2010   OTHER VITAMIN  B12 DEFICIENCY ANEMIA 01/01/2009   FATIGUE 12/26/2008   Hypothyroidism 07/03/2007   TMJ PAIN 07/03/2007   Enlarged lymph nodes 07/03/2007   DERMATITIS, CONTACT, DUE TO PLANTS 01/17/2007   Vitamin B 12 deficiency 11/11/2006   ALLERGIC RHINITIS, SEASONAL 11/11/2006   BAKER'S CYST, LEFT KNEE 11/11/2006   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   Pollen allergies    Thyroid disease 2008   HYPOTHYROIDISM   Past Surgical History:  Procedure Laterality Date   CESAREAN SECTION  1992   POPLITEAL SYNOVIAL CYST EXCISION Left 1974   Social History   Tobacco Use   Smoking status: Never   Smokeless tobacco: Never  Vaping Use   Vaping status: Never Used  Substance Use Topics   Alcohol use: No   Drug use: No   Social History   Socioeconomic History   Marital status: Married    Spouse name: Not on file   Number of children: 2   Years of education: Not on file   Highest education level: Not on file  Occupational History   Occupation: Careers adviser  Tobacco Use   Smoking status: Never   Smokeless tobacco: Never  Vaping Use   Vaping status: Never Used  Substance and Sexual Activity   Alcohol use: No   Drug use: No   Sexual activity: Yes    Partners: Male    Birth control/protection: Post-menopausal  Other Topics Concern   Not on file  Social History Narrative  Exercise -- running    Social Determinants of Health   Financial Resource Strain: Not on file  Food Insecurity: Not on file  Transportation Needs: Not on file  Physical Activity: Not on file  Stress: Not on file  Social Connections: Not on file  Intimate Partner Violence: Not on file   Family Status  Relation Name Status   Mother  Alive   Father  Deceased at age 80   MGF  Deceased   Daughter  Alive   Daughter  Armed forces training and education officer   Other  (Not Specified)   Neg Hx  (Not Specified)  No partnership data on file   Family History  Problem Relation Age of Onset   Atrial  fibrillation Mother    Cancer Father 60       prostate,Liver cancer   Liver cancer Father    Anuerysm Maternal Grandfather    Diabetes Daughter    Celiac disease Daughter    Diabetes Daughter    Prostate cancer Other    Colon cancer Neg Hx    Allergies  Allergen Reactions   Other     TREES, POLLEN, DUST, MOLD AND ANIMALS      Review of Systems  Constitutional:  Positive for malaise/fatigue. Negative for chills, diaphoresis and fever.  HENT:  Positive for congestion and sore throat. Negative for sinus pain.   Eyes:  Negative for blurred vision.  Respiratory:  Positive for cough. Negative for sputum production, shortness of breath and wheezing.   Cardiovascular:  Negative for chest pain, palpitations and leg swelling.  Gastrointestinal:  Negative for abdominal pain, blood in stool and nausea.  Genitourinary:  Negative for dysuria and frequency.  Musculoskeletal:  Negative for falls.  Skin:  Negative for rash.  Neurological:  Negative for dizziness, loss of consciousness and headaches.  Endo/Heme/Allergies:  Negative for environmental allergies.  Psychiatric/Behavioral:  Negative for depression. The patient is not nervous/anxious.       Objective:     BP 108/60 (BP Location: Left Arm, Patient Position: Sitting, Cuff Size: Normal)   Pulse 71   Temp 97.9 F (36.6 C) (Oral)   Resp 18   Ht 5\' 1"  (1.549 m)   Wt 123 lb 12.8 oz (56.2 kg)   LMP 07/02/2011   SpO2 100%   BMI 23.39 kg/m  BP Readings from Last 3 Encounters:  04/07/23 108/60  03/24/22 102/62  03/18/22 110/70   Wt Readings from Last 3 Encounters:  04/07/23 123 lb 12.8 oz (56.2 kg)  03/24/22 121 lb (54.9 kg)  03/18/22 123 lb (55.8 kg)   SpO2 Readings from Last 3 Encounters:  04/07/23 100%  03/24/22 98%  03/18/22 95%      Physical Exam Vitals and nursing note reviewed.  Constitutional:      General: She is not in acute distress.    Appearance: Normal appearance. She is well-developed.  HENT:      Head: Normocephalic and atraumatic.     Right Ear: Tympanic membrane, ear canal and external ear normal. There is no impacted cerumen.     Left Ear: Tympanic membrane, ear canal and external ear normal. There is no impacted cerumen.     Nose: Nose normal.     Mouth/Throat:     Mouth: Mucous membranes are moist.     Pharynx: Oropharynx is clear. No oropharyngeal exudate or posterior oropharyngeal erythema.  Eyes:     General: No scleral icterus.       Right eye: No discharge.  Left eye: No discharge.     Conjunctiva/sclera: Conjunctivae normal.     Pupils: Pupils are equal, round, and reactive to light.  Neck:     Thyroid: No thyromegaly or thyroid tenderness.     Vascular: No JVD.  Cardiovascular:     Rate and Rhythm: Normal rate and regular rhythm.     Heart sounds: Normal heart sounds. No murmur heard. Pulmonary:     Effort: Pulmonary effort is normal. No respiratory distress.     Breath sounds: Normal breath sounds.  Abdominal:     General: Bowel sounds are normal. There is no distension.     Palpations: Abdomen is soft. There is no mass.     Tenderness: There is no abdominal tenderness. There is no guarding or rebound.  Genitourinary:    Vagina: Normal.  Musculoskeletal:        General: Normal range of motion.     Cervical back: Normal range of motion and neck supple.     Right lower leg: No edema.     Left lower leg: No edema.  Lymphadenopathy:     Cervical: No cervical adenopathy.  Skin:    General: Skin is warm and dry.     Findings: No erythema or rash.  Neurological:     Mental Status: She is alert and oriented to person, place, and time.     Cranial Nerves: No cranial nerve deficit.     Deep Tendon Reflexes: Reflexes are normal and symmetric.  Psychiatric:        Mood and Affect: Mood normal.        Behavior: Behavior normal.        Thought Content: Thought content normal.        Judgment: Judgment normal.      Results for orders placed or performed  in visit on 04/07/23  POC COVID-19  Result Value Ref Range   SARS Coronavirus 2 Ag Negative Negative  POC Influenza A&B (Binax test)  Result Value Ref Range   Influenza A, POC Negative Negative   Influenza B, POC Negative Negative    Last CBC Lab Results  Component Value Date   WBC 4.1 03/18/2022   HGB 14.2 03/18/2022   HCT 41.4 03/18/2022   MCV 92.7 03/18/2022   MCH 31.8 03/14/2019   RDW 12.1 03/18/2022   PLT 235.0 03/18/2022   Last metabolic panel Lab Results  Component Value Date   GLUCOSE 90 03/18/2022   NA 138 03/18/2022   K 4.2 03/18/2022   CL 101 03/18/2022   CO2 31 03/18/2022   BUN 20 03/18/2022   CREATININE 0.67 03/18/2022   GFR 95.59 03/18/2022   CALCIUM 9.5 03/18/2022   PROT 6.7 03/18/2022   ALBUMIN 4.2 03/18/2022   BILITOT 0.7 03/18/2022   ALKPHOS 73 03/18/2022   AST 18 03/18/2022   ALT 13 03/18/2022   ANIONGAP 8 01/30/2015   Last lipids Lab Results  Component Value Date   CHOL 240 (H) 03/18/2022   HDL 74.40 03/18/2022   LDLCALC 149 (H) 03/18/2022   TRIG 81.0 03/18/2022   CHOLHDL 3 03/18/2022   Last hemoglobin A1c Lab Results  Component Value Date   HGBA1C 6.0 09/05/2015   Last thyroid functions Lab Results  Component Value Date   TSH 1.22 03/18/2022   T4TOTAL 8.7 09/21/2018   Last vitamin D No results found for: "25OHVITD2", "25OHVITD3", "VD25OH" Last vitamin B12 and Folate Lab Results  Component Value Date   VITAMINB12 >1500 (H) 03/18/2022  FOLATE 14.3 11/19/2009    Covid and flu were negative   The 10-year ASCVD risk score (Arnett DK, et al., 2019) is: 2.2%    Assessment & Plan:   Problem List Items Addressed This Visit       Unprioritized   Vitamin B 12 deficiency    Check labs       Relevant Orders   Vitamin B12   Other fatigue   Relevant Orders   CBC with Differential/Platelet   Comprehensive metabolic panel   TSH   VITAMIN D 25 Hydroxy (Vit-D Deficiency, Fractures)   Allergic conjunctivitis of both eyes -  Primary    Optivar sent in Pt will let us know if her eyes do not improve      Relevant Medications   azelastine (OPTIVAR) 0.05 % ophthalmic solution   Other Relevant Orders   POC COVID-19 (Completed)   POC Influenza A&B (Binax test) (Completed)    No follow-ups on file.    Donato Schultz, DO

## 2023-04-07 NOTE — Assessment & Plan Note (Signed)
Check labs 

## 2023-04-08 ENCOUNTER — Telehealth: Payer: Self-pay | Admitting: Family Medicine

## 2023-04-08 NOTE — Telephone Encounter (Signed)
Noted. Labs have not been reviewed by Dr. Laury Axon

## 2023-04-08 NOTE — Telephone Encounter (Signed)
Pt called and stated that she has issues getting into MyChart. Therefore, she requested for a nurse to call her to go over her lab results when ready. Please call and advise patient.

## 2023-04-27 ENCOUNTER — Other Ambulatory Visit: Payer: Self-pay | Admitting: Family Medicine

## 2023-04-27 DIAGNOSIS — J302 Other seasonal allergic rhinitis: Secondary | ICD-10-CM

## 2023-05-11 ENCOUNTER — Ambulatory Visit: Payer: BC Managed Care – PPO | Admitting: Nurse Practitioner

## 2023-05-15 ENCOUNTER — Other Ambulatory Visit: Payer: Self-pay | Admitting: Family Medicine

## 2023-07-27 ENCOUNTER — Encounter: Payer: Self-pay | Admitting: Nurse Practitioner

## 2023-07-27 ENCOUNTER — Ambulatory Visit (INDEPENDENT_AMBULATORY_CARE_PROVIDER_SITE_OTHER): Payer: 59 | Admitting: Nurse Practitioner

## 2023-07-27 VITALS — BP 122/82 | HR 63 | Ht 61.25 in | Wt 122.0 lb

## 2023-07-27 DIAGNOSIS — Z1331 Encounter for screening for depression: Secondary | ICD-10-CM

## 2023-07-27 DIAGNOSIS — Z01419 Encounter for gynecological examination (general) (routine) without abnormal findings: Secondary | ICD-10-CM

## 2023-07-27 DIAGNOSIS — Z780 Asymptomatic menopausal state: Secondary | ICD-10-CM | POA: Diagnosis not present

## 2023-07-27 NOTE — Progress Notes (Signed)
Stacey [name] 1960-09-03 [id number]   History:  63 y.o. G2P2 presents for annual exam without GYN complaints. Postmenopausal - no HRT, no bleeding. Normal pap and mammogram history. Hypothyroidism managed by PCP.   Gynecologic History Patient's last menstrual period was 07/02/2011.   Contraception: post menopausal status Sexually active: Yes  Health maintenance Last Pap: 03/20/2021. Results were: Normal neg HPV Last mammogram: 11/02/2022. Results were: Normal Last colonoscopy: 01/02/2014. Results were: Normal, 10 year recall Last Dexa: Not indicated  Past medical history, past surgical history, family history and social history were all reviewed and documented in the EPIC chart. 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 Patient keeps him 3 days a week. Oldest is working on masters degree in music therapy.  ROS:  A ROS was performed and pertinent positives and negatives are included.  Exam:  Vitals:   07/27/23 0803  BP: 122/82  Pulse: 63  SpO2: 99%  Weight: 122 lb (55.3 kg)  Height: 5' 1.25" (1.556 m)     Body mass index is 22.86 kg/m.  General appearance:  Normal Thyroid:  Symmetrical, normal in size, without palpable masses or nodularity. Respiratory  Auscultation:  Clear without wheezing or rhonchi Cardiovascular  Auscultation:  Regular rate, without rubs, murmurs or gallops  Edema/varicosities:  Not grossly evident Abdominal  Soft,nontender, without masses, guarding or rebound.  Liver/spleen:  No organomegaly noted  Hernia:  None appreciated  Skin  Inspection:  Grossly normal   Breasts: Examined lying and sitting.   Right: Without masses, retractions, discharge or axillary adenopathy.   Left: Without masses, retractions, discharge or axillary adenopathy. Pelvic: External genitalia:  no lesions              Urethra:  normal appearing urethra with no masses, tenderness or lesions               Bartholins and Skenes: normal                 Vagina: normal appearing vagina with normal color and discharge, no lesions. Atrophic changes              Cervix: no lesions Bimanual Exam:  Uterus:  no masses or tenderness              Adnexa: no mass, fullness, tenderness              Rectovaginal: Deferred              Anus:  normal, no lesions  Patient informed chaperone available to be present for breast and pelvic exam. Patient has requested no chaperone to be present. Patient has been advised what will be completed during breast and pelvic exam.   Assessment/Plan:  63 y.o. G2P2 for annual exam.   Well female exam with routine gynecological exam - Education provided on SBEs, importance of preventative screenings, current guidelines, high calcium diet, regular exercise, and multivitamin daily. Labs with PCP.  Postmenopausal - no HRT, no bleeding  Screening for cervical cancer - Normal Pap history.   Will repeat at 5-year interval per guidelines.   Screening for breast cancer - Normal mammogram history.  Continue annual screenings. Normal breast exam today.  Screening for colon cancer - 2015 colonoscopy. Will repeat at interval per GI's recommended interval.  Screening for osteoporosis - Average risk. Will plan DXA at age 63.   Return in about 1 year (around 07/26/2024) for Annual.  Stacey [name] [facility], 8:10 AM 07/27/2023

## 2023-08-18 ENCOUNTER — Other Ambulatory Visit: Payer: Self-pay | Admitting: Family Medicine

## 2023-09-03 ENCOUNTER — Other Ambulatory Visit: Payer: Self-pay | Admitting: Family Medicine

## 2023-09-03 DIAGNOSIS — J302 Other seasonal allergic rhinitis: Secondary | ICD-10-CM

## 2023-11-17 ENCOUNTER — Encounter: Payer: Self-pay | Admitting: Family Medicine

## 2023-11-17 ENCOUNTER — Other Ambulatory Visit: Payer: Self-pay | Admitting: Family Medicine

## 2023-12-20 ENCOUNTER — Other Ambulatory Visit: Payer: Self-pay | Admitting: Family Medicine

## 2023-12-20 LAB — HM MAMMOGRAPHY

## 2023-12-28 ENCOUNTER — Other Ambulatory Visit: Payer: Self-pay | Admitting: Family Medicine

## 2023-12-29 ENCOUNTER — Other Ambulatory Visit: Payer: Self-pay | Admitting: Family Medicine

## 2023-12-29 NOTE — Telephone Encounter (Signed)
 Copied from CRM [id number]. Topic: Clinical - Prescription Issue >> Dec 29, 2023 12:47 PM Chiquita SQUIBB wrote: Reason for CRM: Patient is calling in regarding the levothyroxine  (SYNTHROID ) 75 MCG tablet [[id number]] medication. The patient ran out of it yesterday and is having a lot of issues with getting it refilled. Please advise the patient if it has any issues.

## 2023-12-29 NOTE — Telephone Encounter (Signed)
 30 day supply sent. Will need appt for further refills.

## 2024-01-12 ENCOUNTER — Other Ambulatory Visit: Payer: Self-pay | Admitting: Family Medicine

## 2024-01-12 DIAGNOSIS — J302 Other seasonal allergic rhinitis: Secondary | ICD-10-CM

## 2024-01-22 ENCOUNTER — Other Ambulatory Visit: Payer: Self-pay | Admitting: Family Medicine

## 2024-02-11 ENCOUNTER — Other Ambulatory Visit: Payer: Self-pay | Admitting: Family Medicine

## 2024-02-17 ENCOUNTER — Ambulatory Visit (INDEPENDENT_AMBULATORY_CARE_PROVIDER_SITE_OTHER): Admitting: Family Medicine

## 2024-02-17 ENCOUNTER — Encounter: Payer: Self-pay | Admitting: Family Medicine

## 2024-02-17 VITALS — BP 98/70 | Temp 98.2°F | Resp 16 | Ht 61.25 in | Wt 123.6 lb

## 2024-02-17 DIAGNOSIS — Z0000 Encounter for general adult medical examination without abnormal findings: Secondary | ICD-10-CM

## 2024-02-17 DIAGNOSIS — E538 Deficiency of other specified B group vitamins: Secondary | ICD-10-CM

## 2024-02-17 DIAGNOSIS — E038 Other specified hypothyroidism: Secondary | ICD-10-CM | POA: Diagnosis not present

## 2024-02-17 DIAGNOSIS — Z23 Encounter for immunization: Secondary | ICD-10-CM

## 2024-02-17 DIAGNOSIS — E782 Mixed hyperlipidemia: Secondary | ICD-10-CM | POA: Diagnosis not present

## 2024-02-17 DIAGNOSIS — Z1211 Encounter for screening for malignant neoplasm of colon: Secondary | ICD-10-CM

## 2024-02-17 LAB — CBC WITH DIFFERENTIAL/PLATELET
Basophils Absolute: 0.1 K/uL (ref 0.0–0.1)
Basophils Relative: 2 % (ref 0.0–3.0)
Eosinophils Absolute: 0.1 K/uL (ref 0.0–0.7)
Eosinophils Relative: 2 % (ref 0.0–5.0)
HCT: 42.5 % (ref 36.0–46.0)
Hemoglobin: 14.3 g/dL (ref 12.0–15.0)
Lymphocytes Relative: 29.3 % (ref 12.0–46.0)
Lymphs Abs: 1.1 K/uL (ref 0.7–4.0)
MCHC: 33.6 g/dL (ref 30.0–36.0)
MCV: 93 fl (ref 78.0–100.0)
Monocytes Absolute: 0.3 K/uL (ref 0.1–1.0)
Monocytes Relative: 8.2 % (ref 3.0–12.0)
Neutro Abs: 2.3 K/uL (ref 1.4–7.7)
Neutrophils Relative %: 58.5 % (ref 43.0–77.0)
Platelets: 229 K/uL (ref 150.0–400.0)
RBC: 4.57 Mil/uL (ref 3.87–5.11)
RDW: 12.1 % (ref 11.5–15.5)
WBC: 3.9 K/uL — ABNORMAL LOW (ref 4.0–10.5)

## 2024-02-17 LAB — COMPREHENSIVE METABOLIC PANEL WITH GFR
ALT: 14 U/L (ref 0–35)
AST: 20 U/L (ref 0–37)
Albumin: 4.3 g/dL (ref 3.5–5.2)
Alkaline Phosphatase: 67 U/L (ref 39–117)
BUN: 24 mg/dL — ABNORMAL HIGH (ref 6–23)
CO2: 32 meq/L (ref 19–32)
Calcium: 9.4 mg/dL (ref 8.4–10.5)
Chloride: 101 meq/L (ref 96–112)
Creatinine, Ser: 0.71 mg/dL (ref 0.40–1.20)
GFR: 91.75 mL/min (ref >60.00)
Glucose, Bld: 87 mg/dL (ref 70–99)
Potassium: 4.1 meq/L (ref 3.5–5.1)
Sodium: 139 meq/L (ref 135–145)
Total Bilirubin: 0.6 mg/dL (ref 0.2–1.2)
Total Protein: 6.7 g/dL (ref 6.0–8.3)

## 2024-02-17 LAB — LIPID PANEL
Cholesterol: 253 mg/dL — ABNORMAL HIGH (ref 0–200)
HDL: 75.1 mg/dL (ref >39.00)
LDL Cholesterol: 161 mg/dL — ABNORMAL HIGH (ref 0–99)
NonHDL: 178.12
Total CHOL/HDL Ratio: 3
Triglycerides: 84 mg/dL (ref 0.0–149.0)
VLDL: 16.8 mg/dL (ref 0.0–40.0)

## 2024-02-17 LAB — VITAMIN B12: Vitamin B-12: 1080 pg/mL — ABNORMAL HIGH (ref 211–911)

## 2024-02-17 LAB — TSH: TSH: 1.5 u[IU]/mL (ref 0.35–5.50)

## 2024-02-17 MED ORDER — LEVOTHYROXINE SODIUM 75 MCG PO TABS: 75.0000 ug | ORAL_TABLET | Freq: Every day | ORAL | 3 refills | Status: AC

## 2024-02-17 NOTE — Assessment & Plan Note (Signed)
 Ghm utd Check labs  See AVS  Health Maintenance  Topic Date Due   HIV Screening  Never done   Pneumococcal Vaccine: 50+ Years (1 of 1 - PCV) Never done   Zoster Vaccines- Shingrix (1 of 2) Never done   Colonoscopy  01/03/2024   COVID-19 Vaccine (3 - 2025-26 season) 02/06/2024   Influenza Vaccine  09/04/2024 (Originally 01/06/2024)   Mammogram  12/19/2024   Cervical Cancer Screening (HPV/Pap Cotest)  03/20/2026   DTaP/Tdap/Td (3 - Td or Tdap) 03/18/2032   Hepatitis C Screening  Completed   Hepatitis B Vaccines 19-59 Average Risk  Aged Out   HPV VACCINES  Aged Out   Meningococcal B Vaccine  Aged Out

## 2024-02-17 NOTE — Progress Notes (Signed)
 Subjective:    Patient ID: Stacey [name], female    DOB: [birth year], 63 y.o.   MRN: [id number]  Chief Complaint  Patient presents with   Annual Exam    Pt states fasting     HPI Patient is in today for cpe Discussed the use of AI scribe software for clinical note transcription with the patient, who gave verbal consent to proceed.  History of Present Illness Stacey [name] is a 63 year old female who presents for an annual physical exam.  She has experienced issues with receiving her levothyroxine  prescription on time, which occurred two months ago and again recently. She takes levothyroxine  75 micrograms daily and uses [facility] and [facility] for her prescriptions.  She has not had a colonoscopy, which was due in July. She attributes this oversight to moving into a new house after living in an RV for two years.  She does not receive flu shots and is uncertain about her shingles vaccination status. She has not received the shingles vaccine from her current provider. She had a TDAP vaccine in 2023.  She does not regularly visit an eye doctor but does see a dentist. No changes in her vision and no changes in her family history.  She continues to run regularly and enjoys spending time with her grandchild, whom she cares for three times a week. 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   Pollen allergies    Thyroid  disease 2008   HYPOTHYROIDISM    Past Surgical History:  Procedure Laterality Date   CESAREAN SECTION  1992   POPLITEAL SYNOVIAL CYST EXCISION Left 1974    Family History  Problem Relation Age of Onset   Atrial fibrillation Mother    Cancer Father 3       prostate,Liver cancer   Liver cancer Father    Anuerysm Maternal Grandfather    Diabetes Daughter    Celiac disease Daughter     Diabetes Daughter    Prostate cancer Other    Colon cancer Neg Hx     Social History   Socioeconomic History   Marital status: Married    Spouse name: Not on file   Number of children: 2   Years of education: Not on file   Highest education level: Not on file  Occupational History   Occupation: Careers adviser  Tobacco Use   Smoking status: Never   Smokeless tobacco: Never  Vaping Use   Vaping status: Never Used  Substance and Sexual Activity   Alcohol use: No   Drug use: No   Sexual activity: Yes    Partners: Male    Birth control/protection: Post-menopausal  Other Topics Concern   Not on file  Social History Narrative   Exercise -- running    Social Drivers of Health   Financial Resource Strain: Not on file  Food Insecurity: Not on file  Transportation Needs: Not on file  Physical Activity: Not on file  Stress: Not on file  Social Connections: Not on file  Intimate Partner Violence: Not on file    Outpatient Medications Prior to Visit  Medication Sig Dispense Refill   ascorbic acid (VITAMIN C) 250 MG CHEW Chew 250 mg by mouth daily.     azelastine  (OPTIVAR ) 0.05 % ophthalmic solution Place  1 drop into both eyes 2 (two) times daily. 6 mL 12   cetirizine (ZYRTEC) 10 MG tablet Take 10 mg by mouth daily.     Cholecalciferol (VITAMIN D ) 1000 UNITS capsule Take 1,000 Units by mouth daily.       Cyanocobalamin  (B-12) 2500 MCG TABS Take 1 tablet by mouth daily.     fluticasone  (FLONASE ) 50 MCG/ACT nasal spray Use 2 spray(s) in each nostril once daily 16 g 0   ibuprofen (ADVIL) 600 MG tablet Take 600 mg by mouth every 6 (six) hours as needed.     meclizine  (ANTIVERT ) 25 MG tablet TAKE 1 TABLET BY MOUTH THREE TIMES DAILY AS NEEDED FOR DIZZINESS 90 tablet 1   Misc Natural Products (AIRBORNE ELDERBERRY) CHEW Chew 1 tablet by mouth daily.     Multiple Vitamin (MULTIVITAMIN) tablet Take 1 tablet by mouth daily.       Omega-3 Fatty Acids (FISH OIL) 1000 MG CAPS Take by  mouth.     levothyroxine  (SYNTHROID ) 75 MCG tablet Take 1 tablet (75 mcg total) by mouth daily before breakfast. Needs appt 30 tablet 0   No facility-administered medications prior to visit.    Allergies  Allergen Reactions   Other     TREES, POLLEN, DUST, MOLD AND ANIMALS    Review of Systems  Constitutional:  Negative for chills, fever and malaise/fatigue.  HENT:  Negative for congestion and hearing loss.   Eyes:  Negative for blurred vision and discharge.  Respiratory:  Negative for cough, sputum production and shortness of breath.   Cardiovascular:  Negative for chest pain, palpitations and leg swelling.  Gastrointestinal:  Negative for abdominal pain, blood in stool, constipation, diarrhea, heartburn, nausea and vomiting.  Genitourinary:  Negative for dysuria, frequency, hematuria and urgency.  Musculoskeletal:  Negative for back pain, falls and myalgias.  Skin:  Negative for rash.  Neurological:  Negative for dizziness, sensory change, loss of consciousness, weakness and headaches.  Endo/Heme/Allergies:  Negative for environmental allergies. Does not bruise/bleed easily.  Psychiatric/Behavioral:  Negative for depression and suicidal ideas. The patient is not nervous/anxious and does not have insomnia.        Objective:    Physical Exam Vitals and nursing note reviewed.  Constitutional:      General: She is not in acute distress.    Appearance: Normal appearance. She is well-developed.  HENT:     Head: Normocephalic and atraumatic.     Right Ear: Tympanic membrane, ear canal and external ear normal. There is no impacted cerumen.     Left Ear: Tympanic membrane, ear canal and external ear normal. There is no impacted cerumen.     Nose: Nose normal.     Mouth/Throat:     Mouth: Mucous membranes are moist.     Pharynx: Oropharynx is clear. No oropharyngeal exudate or posterior oropharyngeal erythema.  Eyes:     General: No scleral icterus.       Right eye: No discharge.         Left eye: No discharge.     Conjunctiva/sclera: Conjunctivae normal.     Pupils: Pupils are equal, round, and reactive to light.  Neck:     Thyroid : No thyromegaly or thyroid  tenderness.     Vascular: No JVD.  Cardiovascular:     Rate and Rhythm: Normal rate and regular rhythm.     Heart sounds: Normal heart sounds. No murmur heard. Pulmonary:     Effort: Pulmonary effort is normal. No respiratory distress.  Breath sounds: Normal breath sounds.  Abdominal:     General: Bowel sounds are normal. There is no distension.     Palpations: Abdomen is soft. There is no mass.     Tenderness: There is no abdominal tenderness. There is no guarding or rebound.  Musculoskeletal:        General: Normal range of motion.     Cervical back: Normal range of motion and neck supple.     Right lower leg: No edema.     Left lower leg: No edema.  Lymphadenopathy:     Cervical: No cervical adenopathy.  Skin:    General: Skin is warm and dry.     Findings: No erythema or rash.  Neurological:     Mental Status: She is alert and oriented to person, place, and time.     Cranial Nerves: No cranial nerve deficit.     Deep Tendon Reflexes: Reflexes are normal and symmetric.  Psychiatric:        Mood and Affect: Mood normal.        Behavior: Behavior normal.        Thought Content: Thought content normal.        Judgment: Judgment normal.     BP 98/70 (BP Location: Left Arm, Patient Position: Sitting, Cuff Size: Normal)   Temp 98.2 F (36.8 C) (Oral)   Resp 16   Ht 5' 1.25 (1.556 m)   Wt 123 lb 9.6 oz (56.1 kg)   LMP 07/02/2011   BMI 23.16 kg/m  Wt Readings from Last 3 Encounters:  02/17/24 123 lb 9.6 oz (56.1 kg)  07/27/23 122 lb (55.3 kg)  04/07/23 123 lb 12.8 oz (56.2 kg)    Diabetic Foot Exam - Simple   No data filed    Lab Results  Component Value Date   WBC 4.3 04/07/2023   HGB 14.5 04/07/2023   HCT 44.5 04/07/2023   PLT 250.0 04/07/2023   GLUCOSE 86 04/07/2023    CHOL 240 (H) 03/18/2022   TRIG 81.0 03/18/2022   HDL 74.40 03/18/2022   LDLCALC 149 (H) 03/18/2022   ALT 15 04/07/2023   AST 21 04/07/2023   NA 139 04/07/2023   K 4.0 04/07/2023   CL 99 04/07/2023   CREATININE 0.74 04/07/2023   BUN 18 04/07/2023   CO2 32 04/07/2023   TSH 1.75 04/07/2023   HGBA1C 6.0 09/05/2015    Lab Results  Component Value Date   TSH 1.75 04/07/2023   Lab Results  Component Value Date   WBC 4.3 04/07/2023   HGB 14.5 04/07/2023   HCT 44.5 04/07/2023   MCV 94.7 04/07/2023   PLT 250.0 04/07/2023   Lab Results  Component Value Date   NA 139 04/07/2023   K 4.0 04/07/2023   CO2 32 04/07/2023   GLUCOSE 86 04/07/2023   BUN 18 04/07/2023   CREATININE 0.74 04/07/2023   BILITOT 0.6 04/07/2023   ALKPHOS 69 04/07/2023   AST 21 04/07/2023   ALT 15 04/07/2023   PROT 6.9 04/07/2023   ALBUMIN 4.3 04/07/2023   CALCIUM 9.6 04/07/2023   ANIONGAP 8 01/30/2015   GFR 87.84 04/07/2023   Lab Results  Component Value Date   CHOL 240 (H) 03/18/2022   Lab Results  Component Value Date   HDL 74.40 03/18/2022   Lab Results  Component Value Date   LDLCALC 149 (H) 03/18/2022   Lab Results  Component Value Date   TRIG 81.0 03/18/2022   Lab Results  Component Value Date   CHOLHDL 3 03/18/2022   Lab Results  Component Value Date   HGBA1C 6.0 09/05/2015       Assessment & Plan:  Preventative health care Assessment & Plan: Ghm utd Check labs  See AVS  Health Maintenance  Topic Date Due   HIV Screening  Never done   Pneumococcal Vaccine: 50+ Years (1 of 1 - PCV) Never done   Zoster Vaccines- Shingrix (1 of 2) Never done   Colonoscopy  01/03/2024   COVID-19 Vaccine (3 - 2025-26 season) 02/06/2024   Influenza Vaccine  09/04/2024 (Originally 01/06/2024)   Mammogram  12/19/2024   Cervical Cancer Screening (HPV/Pap Cotest)  03/20/2026   DTaP/Tdap/Td (3 - Td or Tdap) 03/18/2032   Hepatitis C Screening  Completed   Hepatitis B Vaccines 19-59 Average Risk   Aged Out   HPV VACCINES  Aged Out   Meningococcal B Vaccine  Aged Out     Orders: -     CBC with Differential/Platelet -     Comprehensive metabolic panel with GFR -     Lipid panel -     TSH  Vitamin B 12 deficiency -     Vitamin B12  Mixed hyperlipidemia -     Comprehensive metabolic panel with GFR -     Lipid panel  Other specified hypothyroidism -     TSH -     Levothyroxine  Sodium; Take 1 tablet (75 mcg total) by mouth daily before breakfast. Needs appt  Dispense: 90 tablet; Refill: 3  Colon cancer screening -     Ambulatory referral to Gastroenterology  Need for shingles vaccine   Assessment and Plan Assessment & Plan Adult Wellness Visit   This routine adult wellness visit revealed no acute issues. Vision and hearing remain stable, and she maintains regular dental visits. There are no changes in family history, and she continues her regular running regimen. Schedule a colonoscopy, with GI to contact within a week. Shingles vaccination was discussed, noting it requires two doses 2-6 months apart, is 98% effective, and not live, with side effects ranging from none to flu-like symptoms. She may consider this at a later date. Pneumonia vaccination for those aged 47 and over was also discussed and may be considered later. Order labs today. Deactivate MyChart account at checkout.  Hypothyroidism   Her hypothyroidism is well-managed with levothyroxine  75 mcg. Prescription issues have been resolved, and she continues to use [facility] for medication. Continue levothyroxine  75 mcg.   Meghanne Pletz R Lowne Chase, DO

## 2024-02-23 ENCOUNTER — Other Ambulatory Visit: Payer: Self-pay | Admitting: Family Medicine

## 2024-02-23 DIAGNOSIS — J302 Other seasonal allergic rhinitis: Secondary | ICD-10-CM

## 2024-02-24 ENCOUNTER — Ambulatory Visit: Payer: Self-pay | Admitting: Family Medicine

## 2024-03-15 NOTE — Progress Notes (Signed)
 Stacey [name]                                          MRN: [id number]   03/15/2024   The [facility] Quality Team Specialist reviewed this patient medical record for the purposes of chart review for care gap closure. The following were reviewed: chart review for care gap closure-colorectal cancer screening. Last colonoscopy 2015. Discussed with pcp    [facility] Quality Team

## 2024-03-22 ENCOUNTER — Encounter: Payer: Self-pay | Admitting: Gastroenterology

## 2024-03-23 ENCOUNTER — Ambulatory Visit

## 2024-04-12 ENCOUNTER — Ambulatory Visit

## 2024-04-12 VITALS — Ht 61.5 in | Wt 123.6 lb

## 2024-04-12 DIAGNOSIS — Z1211 Encounter for screening for malignant neoplasm of colon: Secondary | ICD-10-CM

## 2024-04-12 MED ORDER — NA SULFATE-K SULFATE-MG SULF 17.5-3.13-1.6 GM/177ML PO SOLN: 1.0000 | Freq: Once | ORAL | 0 refills | Status: AC

## 2024-04-12 NOTE — Progress Notes (Signed)

## 2024-04-23 ENCOUNTER — Encounter: Payer: Self-pay | Admitting: Gastroenterology

## 2024-04-23 ENCOUNTER — Telehealth: Payer: Self-pay | Admitting: Gastroenterology

## 2024-04-23 NOTE — Telephone Encounter (Signed)
 Inbound call from patient requesting to know weather she is allowed to have Dayquil or Nyquil. Patient is schedule for a colonoscopy on November the 20 th. Please advise.

## 2024-04-23 NOTE — Telephone Encounter (Signed)
 Returned pts call.  Advised her it is ok to take dayquil or nyquil.

## 2024-04-24 ENCOUNTER — Encounter: Payer: Self-pay | Admitting: Student

## 2024-04-24 ENCOUNTER — Ambulatory Visit: Admitting: Student

## 2024-04-24 ENCOUNTER — Ambulatory Visit: Payer: Self-pay

## 2024-04-24 VITALS — BP 114/78 | HR 79 | Temp 97.8°F | Ht 61.5 in | Wt 125.8 lb

## 2024-04-24 DIAGNOSIS — R062 Wheezing: Secondary | ICD-10-CM | POA: Diagnosis not present

## 2024-04-24 DIAGNOSIS — J069 Acute upper respiratory infection, unspecified: Secondary | ICD-10-CM | POA: Insufficient documentation

## 2024-04-24 DIAGNOSIS — R051 Acute cough: Secondary | ICD-10-CM | POA: Diagnosis not present

## 2024-04-24 LAB — POCT INFLUENZA A/B
Influenza A, POC: NEGATIVE
Influenza B, POC: NEGATIVE

## 2024-04-24 LAB — POC COVID19 BINAXNOW: SARS Coronavirus 2 Ag: NEGATIVE

## 2024-04-24 MED ORDER — GUAIFENESIN ER 600 MG PO TB12: 1200.0000 mg | ORAL_TABLET | Freq: Two times a day (BID) | ORAL | 0 refills | Status: AC

## 2024-04-24 MED ORDER — ALBUTEROL SULFATE HFA 108 (90 BASE) MCG/ACT IN AERS: 2.0000 | INHALATION_SPRAY | Freq: Four times a day (QID) | RESPIRATORY_TRACT | 2 refills | Status: AC | PRN

## 2024-04-24 MED ORDER — BENZONATATE 100 MG PO CAPS: 100.0000 mg | ORAL_CAPSULE | Freq: Three times a day (TID) | ORAL | 0 refills | Status: AC | PRN

## 2024-04-24 NOTE — Telephone Encounter (Signed)
 Appt scheduled

## 2024-04-24 NOTE — Telephone Encounter (Signed)
 Pt r/s. New prep instructions will be mailed to address on file

## 2024-04-24 NOTE — Telephone Encounter (Signed)
 FYI Only or Action Required?: FYI only for provider: appointment scheduled on 04/24/24.  Patient was last seen in primary care on 02/17/2024 by Antonio Meth, Jamee SAUNDERS, DO.  Called Nurse Triage reporting Cough.  Symptoms began several days ago.  Interventions attempted: OTC medications: Dayquil/Nyquil.  Symptoms are: gradually worsening.  Triage Disposition: See HCP Within 4 Hours (Or PCP Triage)  Patient/caregiver understands and will follow disposition?: Yes   Copied from CRM [id number]. Topic: Clinical - Red Word Triage >> Apr 24, 2024  8:03 AM Mesmerise C wrote: [facility] that prompted transfer to Nurse Triage: Patient has a worsening cough describes it as rattling, coughing up a phlegm that's yellow and brown, wheezing, chest pains when coughing been ongoing 3 days Reason for Disposition  Wheezing is present  Answer Assessment - Initial Assessment Questions 1. ONSET: When did the cough begin?      X 3 days 2. SEVERITY: How bad is the cough today?      Intermittent 3. SPUTUM: Describe the color of your sputum (e.g., none, dry cough; clear, white, yellow, green)     Yellow, brown 4. HEMOPTYSIS: Are you coughing up any blood? If Yes, ask: How much? (e.g., flecks, streaks, tablespoons, etc.)     None 5. DIFFICULTY BREATHING: Are you having difficulty breathing? If Yes, ask: How bad is it? (e.g., mild, moderate, severe)      None 6. FEVER: Do you have a fever? If Yes, ask: What is your temperature, how was it measured, and when did it start?     None 7. CARDIAC HISTORY: Do you have any history of heart disease? (e.g., heart attack, congestive heart failure)      None 8. LUNG HISTORY: Do you have any history of lung disease?  (e.g., pulmonary embolus, asthma, emphysema)     None 10. OTHER SYMPTOMS: Do you have any other symptoms? (e.g., runny nose, wheezing, chest pain)       Chest congestion, difficult to cough up sputum, chest pain with coughing 12.  TRAVEL: Have you traveled out of the country in the last month? (e.g., travel history, exposures)       None  Protocols used: Cough - Acute Productive-A-AH

## 2024-04-24 NOTE — Telephone Encounter (Signed)
 Patient called stating she was seen with provider. States she was told she had a virus. Was not given antibiotics but a prescription for cough. States she was also advised she may be contagious up until 7 day after symptoms started on Saturday. States she is feeling okay. Requesting a call to discuss if she should proceed with 11/20 colonoscopy. Please advise, thank you.

## 2024-04-24 NOTE — Progress Notes (Signed)
 No chief complaint on file.   Stacey [name] here for URI complaints. Husband   Her symptoms began on Saturday after her husband's illness. She has a productive cough with yellowish-mucus. There is no nasal congestion, but she feels stuffed in the back of her throat and chest, with chest pain when coughing. No difficulty breathing or shortness of breath is present. Coughing causes slight throat soreness. She takes Zyrtec and fluticasone  daily for seasonal allergies.  She received vaccinations for flu, pneumonia, and shingles at the end of October. Her husband managed his symptoms with symptomatic treatment without antibiotics.   Duration: 3 days  Denies: sinus headache, sinus congestion, sinus pain, and sore throat Sick contacts: Yes  Patient denies fever, chills, SOB, CP, palpitations, dyspnea, edema, HA, vision changes, N/V/D, abdominal pain, urinary symptoms, rash, weight changes,   Past Medical History:  Diagnosis Date   Allergy    Hyperlipidemia    Nephrolith 11/05/2005   Pollen allergies    Thyroid  disease 06/07/2006   HYPOTHYROIDISM    Objective BP 114/78   Pulse 79   Temp 97.8 F (36.6 C)   Ht 5' 1.5 (1.562 m)   Wt 125 lb 12.8 oz (57.1 kg)   LMP 07/02/2011   SpO2 99%   BMI 23.38 kg/m  General: Awake, alert, appears stated age HEENT: AT, [place], ears patent b/l and TM's neg, nares patent w/o discharge, pharynx pink and without exudates, MMM Neck: No masses or asymmetry Heart: RRR Lungs: Clear, no accessory muscle use. Mild expiratory wheeze Psych: Age appropriate judgment and insight, normal mood and affect  Viral upper respiratory tract infection  Acute cough - Plan: benzonatate (TESSALON) 100 MG capsule, POC COVID-19 BinaxNow, POCT Influenza A/B  Wheezing - Plan: albuterol  (VENTOLIN  HFA) 108 (90 Base) MCG/ACT inhaler, POC COVID-19 BinaxNow, POCT Influenza A/B   Likely viral etiology. COVID and flu negative - Prescribed Tessalon Perles for cough, Mucinex  -  Advised hydration with at least 64 ounces of water daily. - Rx albuterol  for wheezing or SOB - Offered nebulizer treatment in office, Pt declines. - Monitor symptoms and contact clinic if symptoms worsen or persist Continue to push fluids, practice good hand hygiene, cover mouth when coughing. F/u prn. If starting to experience fevers, shaking, or shortness of breath, seek immediate care. Pt voiced understanding and agreement to the plan.  Stacey LITTIE Jolly, DNP, AGNP-C 04/24/24 1:28 PM

## 2024-04-26 ENCOUNTER — Encounter: Admitting: Gastroenterology

## 2024-04-27 ENCOUNTER — Ambulatory Visit: Payer: Self-pay

## 2024-04-27 ENCOUNTER — Other Ambulatory Visit: Payer: Self-pay

## 2024-04-27 MED ORDER — PROMETHAZINE-DM 6.25-15 MG/5ML PO SYRP: 5.0000 mL | ORAL_SOLUTION | Freq: Four times a day (QID) | ORAL | 0 refills | Status: DC | PRN

## 2024-04-27 MED ORDER — PROMETHAZINE-DM 6.25-15 MG/5ML PO SYRP: 5.0000 mL | ORAL_SOLUTION | Freq: Four times a day (QID) | ORAL | 0 refills | Status: AC | PRN

## 2024-04-27 NOTE — Telephone Encounter (Signed)
 Pt.notified

## 2024-04-27 NOTE — Telephone Encounter (Signed)
 FYI Only or Action Required?: FYI only for provider: home care.  Patient was last seen in primary care on 04/24/2024 by Wheeler Harlene CROME, NP.  Called Nurse Triage reporting Cough.  Symptoms began yesterday.  Interventions attempted: Prescription medications: tessalon , mucinex , albuterol .  Symptoms are: unchanged.  Triage Disposition: Home Care  Patient/caregiver understands and will follow disposition?: Yes          Reason for Disposition  [1] Sore throat with cough/cold symptoms AND [2] present < 5 days    Pt unsure of sore throat r.t coughing fits. Recently seen by PCP and dx with viral URI. Pt denies seeing white patches, and unknown exposure to strep.  Answer Assessment - Initial Assessment Questions 1. ONSET: When did the throat start hurting? (Hours or days ago)      yesterday 2. SEVERITY: How bad is the sore throat? (Scale 1-10; mild, moderate or severe)     Had one episode of post-tussive emesis 3. STREP EXPOSURE: Has there been any exposure to strep within the past week? If Yes, ask: What type of contact occurred?      denies 4.  VIRAL SYMPTOMS: Are there any symptoms of a cold, such as a runny nose, cough, hoarse voice or red eyes?      cough 5. FEVER: Do you have a fever? If Yes, ask: What is your temperature, how was it measured, and when did it start?     denies 6. PUS ON THE TONSILS: Is there pus on the tonsils in the back of your throat?     denies 7. OTHER SYMPTOMS: Do you have any other symptoms? (e.g., difficulty breathing, headache, rash)     Headache only 8. PREGNANCY: Is there any chance you are pregnant? When was your last menstrual period?     N/a  Answer Assessment - Initial Assessment Questions 1. ONSET: When did the cough begin?      2. SEVERITY: How bad is the cough today?      *No Answer* 3. SPUTUM: Describe the color of your sputum (e.g., none, dry cough; clear, white, yellow, green)     *No Answer* 4.  HEMOPTYSIS: Are you coughing up any blood? If Yes, ask: How much? (e.g., flecks, streaks, tablespoons, etc.)     denies 5. DIFFICULTY BREATHING: Are you having difficulty breathing? If Yes, ask: How bad is it? (e.g., mild, moderate, severe)      *No Answer* 6. FEVER: Do you have a fever? If Yes, ask: What is your temperature, how was it measured, and when did it start?     denies 7. CARDIAC HISTORY: Do you have any history of heart disease? (e.g., heart attack, congestive heart failure)      *No Answer* 8. LUNG HISTORY: Do you have any history of lung disease?  (e.g., pulmonary embolus, asthma, emphysema)     *No Answer* 9. PE RISK FACTORS: Do you have a history of blood clots? (or: recent major surgery, recent prolonged travel, bedridden)     *No Answer* 10. OTHER SYMPTOMS: Do you have any other symptoms? (e.g., runny nose, wheezing, chest pain)       Sore throat, post-tussive emesis 11. PREGNANCY: Is there any chance you are pregnant? When was your last menstrual period?       N/a 12. TRAVEL: Have you traveled out of the country in the last month? (e.g., travel history, exposures)       *No Answer*  Protocols used: Cough - Acute Non-Productive-A-AH, Sore Throat-A-AH

## 2024-04-27 NOTE — Addendum Note (Signed)
 Addended by: WHEELER HARLENE CROME on: 04/27/2024 12:55 PM   Modules accepted: Orders

## 2024-06-08 ENCOUNTER — Encounter: Admitting: Gastroenterology

## 2024-08-01 ENCOUNTER — Ambulatory Visit: Payer: 59 | Admitting: Nurse Practitioner

## 2025-02-21 ENCOUNTER — Encounter: Admitting: Family Medicine
